# Patient Record
Sex: Female | Born: 1995 | Race: Black or African American | Hispanic: No | Marital: Single | Smoking: Never smoker
Health system: Southern US, Community
[De-identification: ages and names within clinical notes are randomized; demographics above are authoritative.]

## PROBLEM LIST (undated history)

## (undated) DIAGNOSIS — N63 Unspecified lump in unspecified breast: Secondary | ICD-10-CM

## (undated) DIAGNOSIS — K219 Gastro-esophageal reflux disease without esophagitis: Secondary | ICD-10-CM

## (undated) HISTORY — PX: TONSILLECTOMY AND ADENOIDECTOMY: SHX28

## (undated) HISTORY — DX: Unspecified lump in unspecified breast: N63.0

## (undated) HISTORY — DX: Gastro-esophageal reflux disease without esophagitis: K21.9

---
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
## 2015-04-15 ENCOUNTER — Other Ambulatory Visit (HOSPITAL_COMMUNITY): Payer: Self-pay | Admitting: Physician Assistant

## 2015-04-15 DIAGNOSIS — N6320 Unspecified lump in the left breast, unspecified quadrant: Secondary | ICD-10-CM

## 2015-04-29 ENCOUNTER — Ambulatory Visit (HOSPITAL_COMMUNITY)
Admission: RE | Admit: 2015-04-29 | Discharge: 2015-04-29 | Disposition: A | Discharge status: Home / Self Care | Payer: Medicaid Other | Source: Ambulatory Visit | Attending: Physician Assistant | Admitting: Physician Assistant

## 2015-04-29 DIAGNOSIS — N6320 Unspecified lump in the left breast, unspecified quadrant: Secondary | ICD-10-CM

## 2015-04-29 DIAGNOSIS — N63 Unspecified lump in breast: Secondary | ICD-10-CM | POA: Insufficient documentation

## 2016-06-25 ENCOUNTER — Encounter: Payer: Self-pay | Admitting: *Deleted

## 2016-07-09 ENCOUNTER — Other Ambulatory Visit: Payer: Self-pay | Admitting: Obstetrics and Gynecology

## 2016-07-09 DIAGNOSIS — O3680X0 Pregnancy with inconclusive fetal viability, not applicable or unspecified: Secondary | ICD-10-CM

## 2016-07-12 ENCOUNTER — Ambulatory Visit (INDEPENDENT_AMBULATORY_CARE_PROVIDER_SITE_OTHER): Payer: Medicaid Other

## 2016-07-12 DIAGNOSIS — Z3A09 9 weeks gestation of pregnancy: Secondary | ICD-10-CM

## 2016-07-12 DIAGNOSIS — O3680X0 Pregnancy with inconclusive fetal viability, not applicable or unspecified: Secondary | ICD-10-CM | POA: Diagnosis not present

## 2016-07-12 NOTE — Progress Notes (Signed)
US 8+3 wks,single IUP w/ys,pos fht 176 bpm,normal ov's bilat,crl 16.8 mm

## 2016-07-21 ENCOUNTER — Ambulatory Visit (INDEPENDENT_AMBULATORY_CARE_PROVIDER_SITE_OTHER): Payer: Medicaid Other | Admitting: Advanced Practice Midwife

## 2016-07-21 ENCOUNTER — Encounter: Payer: Self-pay | Admitting: Advanced Practice Midwife

## 2016-07-21 VITALS — BP 118/70 | HR 80 | Ht 69.0 in | Wt 262.0 lb

## 2016-07-21 DIAGNOSIS — Z331 Pregnant state, incidental: Secondary | ICD-10-CM

## 2016-07-21 DIAGNOSIS — Z3682 Encounter for antenatal screening for nuchal translucency: Secondary | ICD-10-CM

## 2016-07-21 DIAGNOSIS — Z1389 Encounter for screening for other disorder: Secondary | ICD-10-CM

## 2016-07-21 DIAGNOSIS — Z3491 Encounter for supervision of normal pregnancy, unspecified, first trimester: Secondary | ICD-10-CM

## 2016-07-21 DIAGNOSIS — Z0283 Encounter for blood-alcohol and blood-drug test: Secondary | ICD-10-CM

## 2016-07-21 DIAGNOSIS — Z3400 Encounter for supervision of normal first pregnancy, unspecified trimester: Secondary | ICD-10-CM | POA: Insufficient documentation

## 2016-07-21 DIAGNOSIS — Z3401 Encounter for supervision of normal first pregnancy, first trimester: Secondary | ICD-10-CM | POA: Diagnosis not present

## 2016-07-21 DIAGNOSIS — Z3A10 10 weeks gestation of pregnancy: Secondary | ICD-10-CM

## 2016-07-21 DIAGNOSIS — Z1159 Encounter for screening for other viral diseases: Secondary | ICD-10-CM

## 2016-07-21 DIAGNOSIS — Z369 Encounter for antenatal screening, unspecified: Secondary | ICD-10-CM

## 2016-07-21 DIAGNOSIS — Z118 Encounter for screening for other infectious and parasitic diseases: Secondary | ICD-10-CM

## 2016-07-21 LAB — POCT URINALYSIS DIPSTICK
Blood, UA: NEGATIVE
Glucose, UA: NEGATIVE
Ketones, UA: NEGATIVE
Leukocytes, UA: NEGATIVE
Nitrite, UA: NEGATIVE
PROTEIN UA: NEGATIVE

## 2016-07-21 NOTE — Progress Notes (Signed)
  Subjective:    Amanda SayresMoesha T Furlough is a G1P0 4442w5d being seen today for her first obstetrical visit.  Her obstetrical history is significant for first pregnancy.  Pregnancy history fully reviewed.  Patient reports no complaints.  Vitals:   07/21/16 1145 07/21/16 1201  BP: 118/70   Pulse: 80   Weight: 262 lb (118.8 kg)   Height:  5\' 9"  (1.753 m)    HISTORY: OB History  Gravida Para Term Preterm AB Living  1            SAB TAB Ectopic Multiple Live Births               # Outcome Date GA Lbr Len/2nd Weight Sex Delivery Anes PTL Lv  1 Current              Past Medical History:  Diagnosis Date  . Breast lump    left breast  . GERD without esophagitis    Past Surgical History:  Procedure Laterality Date  . TONSILLECTOMY AND ADENOIDECTOMY     Family History  Problem Relation Age of Onset  . Hypertension Mother   . Anemia Mother   . Diabetes Other   . Kidney Stones Other   . Hypertension Maternal Grandmother   . Diabetes Maternal Grandmother   . Pancreatitis Maternal Grandmother   . Cholecystitis Maternal Grandmother   . Prostate cancer Maternal Grandfather   . Heart disease Maternal Grandfather   . Stroke Maternal Grandfather      Exam                                      System:     Skin: normal coloration and turgor, no rashes    Neurologic: oriented, normal, normal mood   Extremities: normal strength, tone, and muscle mass   HEENT PERRLA   Mouth/Teeth mucous membranes moist, normal dentition   Neck supple and no masses   Cardiovascular: regular rate and rhythm   Respiratory:  appears well, vitals normal, no respiratory distress, acyanotic   Abdomen: soft, non-tender;  FHR: 150          Assessment:    Pregnancy: G1P0 Patient Active Problem List   Diagnosis Date Noted  . Supervision of normal first pregnancy 07/21/2016        Plan:     Initial labs drawn. Continue prenatal vitamins  Problem list reviewed and updated  Reviewed n/v  relief measures and warning s/s to report  Reviewed recommended weight gain based on pre-gravid BMI  Encouraged well-balanced diet Genetic Screening discussed Integrated Screen: requested.  Ultrasound discussed; fetal survey: requested.  Return in about 3 weeks (around 08/11/2016) for LROB, US:NT+1st IT.  CRESENZO-DISHMAN,Stepheny Canal 07/21/2016

## 2016-07-21 NOTE — Patient Instructions (Signed)
 First Trimester of Pregnancy The first trimester of pregnancy is from week 1 until the end of week 12 (months 1 through 3). A week after a sperm fertilizes an egg, the egg will implant on the wall of the uterus. This embryo will begin to develop into a baby. Genes from you and your partner are forming the baby. The female genes determine whether the baby is a boy or a girl. At 6-8 weeks, the eyes and face are formed, and the heartbeat can be seen on ultrasound. At the end of 12 weeks, all the baby's organs are formed.  Now that you are pregnant, you will want to do everything you can to have a healthy baby. Two of the most important things are to get good prenatal care and to follow your health care provider's instructions. Prenatal care is all the medical care you receive before the baby's birth. This care will help prevent, find, and treat any problems during the pregnancy and childbirth. BODY CHANGES Your body goes through many changes during pregnancy. The changes vary from woman to woman.   You may gain or lose a couple of pounds at first.  You may feel sick to your stomach (nauseous) and throw up (vomit). If the vomiting is uncontrollable, call your health care provider.  You may tire easily.  You may develop headaches that can be relieved by medicines approved by your health care provider.  You may urinate more often. Painful urination may mean you have a bladder infection.  You may develop heartburn as a result of your pregnancy.  You may develop constipation because certain hormones are causing the muscles that push waste through your intestines to slow down.  You may develop hemorrhoids or swollen, bulging veins (varicose veins).  Your breasts may begin to grow larger and become tender. Your nipples may stick out more, and the tissue that surrounds them (areola) may become darker.  Your gums may bleed and may be sensitive to brushing and flossing.  Dark spots or blotches  (chloasma, mask of pregnancy) may develop on your face. This will likely fade after the baby is born.  Your menstrual periods will stop.  You may have a loss of appetite.  You may develop cravings for certain kinds of food.  You may have changes in your emotions from day to day, such as being excited to be pregnant or being concerned that something may go wrong with the pregnancy and baby.  You may have more vivid and strange dreams.  You may have changes in your hair. These can include thickening of your hair, rapid growth, and changes in texture. Some women also have hair loss during or after pregnancy, or hair that feels dry or thin. Your hair will most likely return to normal after your baby is born. WHAT TO EXPECT AT YOUR PRENATAL VISITS During a routine prenatal visit:  You will be weighed to make sure you and the baby are growing normally.  Your blood pressure will be taken.  Your abdomen will be measured to track your baby's growth.  The fetal heartbeat will be listened to starting around week 10 or 12 of your pregnancy.  Test results from any previous visits will be discussed. Your health care provider may ask you:  How you are feeling.  If you are feeling the baby move.  If you have had any abnormal symptoms, such as leaking fluid, bleeding, severe headaches, or abdominal cramping.  If you have any questions. Other   tests that may be performed during your first trimester include:  Blood tests to find your blood type and to check for the presence of any previous infections. They will also be used to check for low iron levels (anemia) and Rh antibodies. Later in the pregnancy, blood tests for diabetes will be done along with other tests if problems develop.  Urine tests to check for infections, diabetes, or protein in the urine.  An ultrasound to confirm the proper growth and development of the baby.  An amniocentesis to check for possible genetic problems.  Fetal  screens for spina bifida and Down syndrome.  You may need other tests to make sure you and the baby are doing well. HOME CARE INSTRUCTIONS  Medicines  Follow your health care provider's instructions regarding medicine use. Specific medicines may be either safe or unsafe to take during pregnancy.  Take your prenatal vitamins as directed.  If you develop constipation, try taking a stool softener if your health care provider approves. Diet  Eat regular, well-balanced meals. Choose a variety of foods, such as meat or vegetable-based protein, fish, milk and low-fat dairy products, vegetables, fruits, and whole grain breads and cereals. Your health care provider will help you determine the amount of weight gain that is right for you.  Avoid raw meat and uncooked cheese. These carry germs that can cause birth defects in the baby.  Eating four or five small meals rather than three large meals a day may help relieve nausea and vomiting. If you start to feel nauseous, eating a few soda crackers can be helpful. Drinking liquids between meals instead of during meals also seems to help nausea and vomiting.  If you develop constipation, eat more high-fiber foods, such as fresh vegetables or fruit and whole grains. Drink enough fluids to keep your urine clear or pale yellow. Activity and Exercise  Exercise only as directed by your health care provider. Exercising will help you:  Control your weight.  Stay in shape.  Be prepared for labor and delivery.  Experiencing pain or cramping in the lower abdomen or low back is a good sign that you should stop exercising. Check with your health care provider before continuing normal exercises.  Try to avoid standing for long periods of time. Move your legs often if you must stand in one place for a long time.  Avoid heavy lifting.  Wear low-heeled shoes, and practice good posture.  You may continue to have sex unless your health care provider directs you  otherwise. Relief of Pain or Discomfort  Wear a good support bra for breast tenderness.   Take warm sitz baths to soothe any pain or discomfort caused by hemorrhoids. Use hemorrhoid cream if your health care provider approves.   Rest with your legs elevated if you have leg cramps or low back pain.  If you develop varicose veins in your legs, wear support hose. Elevate your feet for 15 minutes, 3-4 times a day. Limit salt in your diet. Prenatal Care  Schedule your prenatal visits by the twelfth week of pregnancy. They are usually scheduled monthly at first, then more often in the last 2 months before delivery.  Write down your questions. Take them to your prenatal visits.  Keep all your prenatal visits as directed by your health care provider. Safety  Wear your seat belt at all times when driving.  Make a list of emergency phone numbers, including numbers for family, friends, the hospital, and police and fire departments. General   Tips  Ask your health care provider for a referral to a local prenatal education class. Begin classes no later than at the beginning of month 6 of your pregnancy.  Ask for help if you have counseling or nutritional needs during pregnancy. Your health care provider can offer advice or refer you to specialists for help with various needs.  Do not use hot tubs, steam rooms, or saunas.  Do not douche or use tampons or scented sanitary pads.  Do not cross your legs for long periods of time.  Avoid cat litter boxes and soil used by cats. These carry germs that can cause birth defects in the baby and possibly loss of the fetus by miscarriage or stillbirth.  Avoid all smoking, herbs, alcohol, and medicines not prescribed by your health care provider. Chemicals in these affect the formation and growth of the baby.  Schedule a dentist appointment. At home, brush your teeth with a soft toothbrush and be gentle when you floss. SEEK MEDICAL CARE IF:   You have  dizziness.  You have mild pelvic cramps, pelvic pressure, or nagging pain in the abdominal area.  You have persistent nausea, vomiting, or diarrhea.  You have a bad smelling vaginal discharge.  You have pain with urination.  You notice increased swelling in your face, hands, legs, or ankles. SEEK IMMEDIATE MEDICAL CARE IF:   You have a fever.  You are leaking fluid from your vagina.  You have spotting or bleeding from your vagina.  You have severe abdominal cramping or pain.  You have rapid weight gain or loss.  You vomit blood or material that looks like coffee grounds.  You are exposed to German measles and have never had them.  You are exposed to fifth disease or chickenpox.  You develop a severe headache.  You have shortness of breath.  You have any kind of trauma, such as from a fall or a car accident. Document Released: 10/12/2001 Document Revised: 03/04/2014 Document Reviewed: 08/28/2013 [employer] Patient Information 2015 [employer]. This information is not intended to replace advice given to you by your health care provider. Make sure you discuss any questions you have with your health care provider.   Nausea & Vomiting  Have saltine crackers or pretzels by your bed and eat a few bites before you raise your head out of bed in the morning  Eat small frequent meals throughout the day instead of large meals  Drink plenty of fluids throughout the day to stay hydrated, just don't drink a lot of fluids with your meals.  This can make your stomach fill up faster making you feel sick  Do not brush your teeth right after you eat  Products with real ginger are good for nausea, like ginger ale and ginger hard candy Make sure it says made with real ginger!  Sucking on sour candy like lemon heads is also good for nausea  If your prenatal vitamins make you nauseated, take them at night so you will sleep through the nausea  Sea Bands  If you feel like you need  medicine for the nausea & vomiting please let us know  If you are unable to keep any fluids or food down please let us know   Constipation  Drink plenty of fluid, preferably water, throughout the day  Eat foods high in fiber such as fruits, vegetables, and grains  Exercise, such as walking, is a good way to keep your bowels regular  Drink warm fluids, especially warm   prune juice, or decaf coffee  Eat a 1/2 cup of real oatmeal (not instant), 1/2 cup applesauce, and 1/2-1 cup warm prune juice every day  If needed, you may take Colace (docusate sodium) stool softener once or twice a day to help keep the stool soft. If you are pregnant, wait until you are out of your first trimester (12-14 weeks of pregnancy)  If you still are having problems with constipation, you may take Miralax once daily as needed to help keep your bowels regular.  If you are pregnant, wait until you are out of your first trimester (12-14 weeks of pregnancy)  Safe Medications in Pregnancy   Acne: Benzoyl Peroxide Salicylic Acid  Backache/Headache: Tylenol: 2 regular strength every 4 hours OR              2 Extra strength every 6 hours  Colds/Coughs/Allergies: Benadryl (alcohol free) 25 mg every 6 hours as needed Breath right strips Claritin Cepacol throat lozenges Chloraseptic throat spray Cold-Eeze- up to three times per day Cough drops, alcohol free Flonase (by prescription only) Guaifenesin Mucinex Robitussin DM (plain only, alcohol free) Saline nasal spray/drops Sudafed (pseudoephedrine) & Actifed ** use only after [redacted] weeks gestation and if you do not have high blood pressure Tylenol Vicks Vaporub Zinc lozenges Zyrtec   Constipation: Colace Ducolax suppositories Fleet enema Glycerin suppositories Metamucil Milk of magnesia Miralax Senokot Smooth move tea  Diarrhea: Kaopectate Imodium A-D  *NO pepto Bismol  Hemorrhoids: Anusol Anusol HC Preparation  H Tucks  Indigestion: Tums Maalox Mylanta Zantac  Pepcid  Insomnia: Benadryl (alcohol free) 25mg every 6 hours as needed Tylenol PM Unisom, no Gelcaps  Leg Cramps: Tums MagGel  Nausea/Vomiting:  Bonine Dramamine Emetrol Ginger extract Sea bands Meclizine  Nausea medication to take during pregnancy:  Unisom (doxylamine succinate 25 mg tablets) Take one tablet daily at bedtime. If symptoms are not adequately controlled, the dose can be increased to a maximum recommended dose of two tablets daily (1/2 tablet in the morning, 1/2 tablet mid-afternoon and one at bedtime). Vitamin B6 100mg tablets. Take one tablet twice a day (up to 200 mg per day).  Skin Rashes: Aveeno products Benadryl cream or 25mg every 6 hours as needed Calamine Lotion 1% cortisone cream  Yeast infection: Gyne-lotrimin 7 Monistat 7   **If taking multiple medications, please check labels to avoid duplicating the same active ingredients **take medication as directed on the label ** Do not exceed 4000 mg of tylenol in 24 hours **Do not take medications that contain aspirin or ibuprofen      

## 2016-07-22 LAB — CBC
HEMOGLOBIN: 13.4 g/dL (ref 11.1–15.9)
Hematocrit: 39.3 % (ref 34.0–46.6)
MCH: 32.1 pg (ref 26.6–33.0)
MCHC: 34.1 g/dL (ref 31.5–35.7)
MCV: 94 fL (ref 79–97)
Platelets: 327 10*3/uL (ref 150–379)
RBC: 4.18 x10E6/uL (ref 3.77–5.28)
RDW: 13.4 % (ref 12.3–15.4)
WBC: 8 10*3/uL (ref 3.4–10.8)

## 2016-07-22 LAB — PMP SCREEN PROFILE (10S), URINE
AMPHETAMINE SCRN UR: NEGATIVE ng/mL
Barbiturate Screen, Ur: NEGATIVE ng/mL
Benzodiazepine Screen, Urine: NEGATIVE ng/mL
Cannabinoids Ur Ql Scn: NEGATIVE ng/mL
Cocaine(Metab.)Screen, Urine: NEGATIVE ng/mL
Creatinine(Crt), U: 80.2 mg/dL (ref 20.0–300.0)
Methadone Scn, Ur: NEGATIVE ng/mL
Opiate Scrn, Ur: NEGATIVE ng/mL
Oxycodone+Oxymorphone Ur Ql Scn: NEGATIVE ng/mL
PCP SCRN UR: NEGATIVE ng/mL
PH UR, DRUG SCRN: 6.4 (ref 4.5–8.9)
Propoxyphene, Screen: NEGATIVE ng/mL

## 2016-07-22 LAB — URINALYSIS, ROUTINE W REFLEX MICROSCOPIC
BILIRUBIN UA: NEGATIVE
Glucose, UA: NEGATIVE
Ketones, UA: NEGATIVE
LEUKOCYTES UA: NEGATIVE
Nitrite, UA: NEGATIVE
PROTEIN UA: NEGATIVE
RBC, UA: NEGATIVE
Specific Gravity, UA: 1.014 (ref 1.005–1.030)
Urobilinogen, Ur: 0.2 mg/dL (ref 0.2–1.0)
pH, UA: 7 (ref 5.0–7.5)

## 2016-07-22 LAB — ANTIBODY SCREEN: ANTIBODY SCREEN: NEGATIVE

## 2016-07-22 LAB — HEPATITIS B SURFACE ANTIGEN: HEP B S AG: NEGATIVE

## 2016-07-22 LAB — RUBELLA SCREEN: Rubella Antibodies, IGG: 5.46 index (ref >0.99)

## 2016-07-22 LAB — ABO/RH: Rh Factor: NEGATIVE

## 2016-07-22 LAB — SICKLE CELL SCREEN: Sickle Cell Screen: NEGATIVE

## 2016-07-22 LAB — RPR: RPR Ser Ql: NONREACTIVE

## 2016-07-22 LAB — VARICELLA ZOSTER ANTIBODY, IGG: VARICELLA: 1697 {index} (ref >165)

## 2016-07-22 LAB — HIV ANTIBODY (ROUTINE TESTING W REFLEX): HIV Screen 4th Generation wRfx: NONREACTIVE

## 2016-07-23 LAB — URINE CULTURE: Organism ID, Bacteria: NO GROWTH

## 2016-07-23 LAB — GC/CHLAMYDIA PROBE AMP
CHLAMYDIA, DNA PROBE: POSITIVE — AB
NEISSERIA GONORRHOEAE BY PCR: NEGATIVE

## 2016-07-26 ENCOUNTER — Encounter: Payer: Self-pay | Admitting: Advanced Practice Midwife

## 2016-07-26 ENCOUNTER — Encounter: Payer: Self-pay | Admitting: Obstetrics and Gynecology

## 2016-07-26 ENCOUNTER — Other Ambulatory Visit: Payer: Self-pay | Admitting: Advanced Practice Midwife

## 2016-07-26 DIAGNOSIS — A749 Chlamydial infection, unspecified: Secondary | ICD-10-CM

## 2016-07-26 DIAGNOSIS — Z8619 Personal history of other infectious and parasitic diseases: Secondary | ICD-10-CM | POA: Insufficient documentation

## 2016-07-26 DIAGNOSIS — O98811 Other maternal infectious and parasitic diseases complicating pregnancy, first trimester: Principal | ICD-10-CM

## 2016-07-26 MED ORDER — AZITHROMYCIN 500 MG PO TABS: 1000.0000 mg | ORAL_TABLET | Freq: Once | ORAL | 0 refills | Status: AC

## 2016-07-26 MED ORDER — AZITHROMYCIN 500 MG PO TABS: 1000.0000 mg | ORAL_TABLET | Freq: Once | ORAL | 0 refills | Status: DC

## 2016-07-26 NOTE — Progress Notes (Unsigned)
Azithromycin 1gm PO fo r+ CHL.  Message left for pt to provide parter's info if he wants rx.

## 2016-08-11 ENCOUNTER — Ambulatory Visit (INDEPENDENT_AMBULATORY_CARE_PROVIDER_SITE_OTHER): Payer: Medicaid Other | Admitting: Obstetrics and Gynecology

## 2016-08-11 ENCOUNTER — Encounter: Payer: Self-pay | Admitting: Obstetrics and Gynecology

## 2016-08-11 ENCOUNTER — Ambulatory Visit (INDEPENDENT_AMBULATORY_CARE_PROVIDER_SITE_OTHER): Payer: Medicaid Other

## 2016-08-11 VITALS — BP 130/76 | HR 64 | Wt 263.8 lb

## 2016-08-11 DIAGNOSIS — Z3682 Encounter for antenatal screening for nuchal translucency: Secondary | ICD-10-CM | POA: Diagnosis not present

## 2016-08-11 DIAGNOSIS — Z1389 Encounter for screening for other disorder: Secondary | ICD-10-CM

## 2016-08-11 DIAGNOSIS — Z3401 Encounter for supervision of normal first pregnancy, first trimester: Secondary | ICD-10-CM

## 2016-08-11 DIAGNOSIS — Z3402 Encounter for supervision of normal first pregnancy, second trimester: Secondary | ICD-10-CM

## 2016-08-11 DIAGNOSIS — Z3A13 13 weeks gestation of pregnancy: Secondary | ICD-10-CM

## 2016-08-11 DIAGNOSIS — Z331 Pregnant state, incidental: Secondary | ICD-10-CM

## 2016-08-11 LAB — POCT URINALYSIS DIPSTICK
Glucose, UA: NEGATIVE
Ketones, UA: NEGATIVE
LEUKOCYTES UA: NEGATIVE
Nitrite, UA: NEGATIVE

## 2016-08-11 NOTE — Progress Notes (Signed)
G1P0  Estimated Date of Delivery: 02/18/17 LROB 5682w5d  Blood pressure 130/76, pulse 64, weight 263 lb 12.8 oz (119.7 kg), last menstrual period 05/14/2016.   Urine results:notable for trace blood and protein  Chief Complaint  Patient presents with  . Routine Prenatal Visit    NT IT    Patient complaints: none at this time. Patient denies any bleeding , rupture of membranes,or regular contractions.   refer to the ob flow sheet for FH and FHR, ,                          Physical Examination: General appearance - alert, well appearing, and in no distress                                      Abdomen - FH not indicated ,                                                         -FHR                                                                                               Pelvic - examination not indicated                                            Questions were answered. Assessment: LROB G1P0 @ 4882w5d                         Plan:  Continued routine obstetrical care,             May continue full work duties.            Classes emphasized. F/u in 4 weeks for routine prenatal visit.    By signing my name below, I, Sonum Patel, attest that this documentation has been prepared under the direction and in the presence of Tilda BurrowJohn V Alaney Witter, MD. Electronically Signed: Sonum Patel, Neurosurgeoncribe. 08/11/16. 9:54 AM.  I personally performed the services described in this documentation, which was SCRIBED in my presence. The recorded information has been reviewed and considered accurate. It has been edited as necessary during review. Tilda BurrowFERGUSON,Darryon Bastin V, MD

## 2016-08-11 NOTE — Progress Notes (Signed)
US 12+5 wks,measurements c/w dates,crl 67.4 mm,NB present,NT 1.6 mm,fhr 170 bpm,ant pl gr 0,normal ov's bilat

## 2016-08-13 LAB — MATERNAL SCREEN, INTEGRATED #1
Crown Rump Length: 67.4 mm
Gest. Age on Collection Date: 12.9 weeks
Maternal Age at EDD: 21.4 years
NUCHAL TRANSLUCENCY (NT): 1.6 mm
Number of Fetuses: 1
PAPP-A VALUE: 695.7 ng/mL
WEIGHT: 264 [lb_av]

## 2016-09-08 ENCOUNTER — Encounter: Payer: Self-pay | Admitting: Advanced Practice Midwife

## 2016-09-08 ENCOUNTER — Ambulatory Visit (INDEPENDENT_AMBULATORY_CARE_PROVIDER_SITE_OTHER): Payer: Medicaid Other | Admitting: Advanced Practice Midwife

## 2016-09-08 VITALS — BP 128/74 | HR 92 | Wt 268.0 lb

## 2016-09-08 DIAGNOSIS — Z3682 Encounter for antenatal screening for nuchal translucency: Secondary | ICD-10-CM

## 2016-09-08 DIAGNOSIS — Z363 Encounter for antenatal screening for malformations: Secondary | ICD-10-CM

## 2016-09-08 DIAGNOSIS — Z3A17 17 weeks gestation of pregnancy: Secondary | ICD-10-CM

## 2016-09-08 DIAGNOSIS — Z1389 Encounter for screening for other disorder: Secondary | ICD-10-CM

## 2016-09-08 DIAGNOSIS — Z3402 Encounter for supervision of normal first pregnancy, second trimester: Secondary | ICD-10-CM

## 2016-09-08 DIAGNOSIS — Z331 Pregnant state, incidental: Secondary | ICD-10-CM

## 2016-09-08 LAB — POCT URINALYSIS DIPSTICK
Glucose, UA: NEGATIVE
KETONES UA: NEGATIVE
LEUKOCYTES UA: NEGATIVE
Nitrite, UA: NEGATIVE

## 2016-09-08 NOTE — Progress Notes (Signed)
G1P0 3770w5d Estimated Date of Delivery: 02/18/17  Blood pressure 128/74, pulse 92, weight 268 lb (121.6 kg), last menstrual period 05/14/2016.   BP weight and urine results all reviewed and noted.  Please refer to the obstetrical flow sheet for the fundal height and fetal heart rate documentation:  Patient reports some fetal movement, denies any bleeding and no rupture of membranes symptoms or regular contractions. Patient is without complaints. All questions were answered.  Orders Placed This Encounter  Procedures  . US OB Comp + 14 Wk  . Maternal Screen, Integrated #2  . POCT urinalysis dipstick    Plan:  Continued routine obstetrical care, 2nd IT today  Return in about 3 weeks (around 09/29/2016) for ZO:[id number]:Anatomy, LROB.

## 2016-09-08 NOTE — Patient Instructions (Signed)

## 2016-09-10 LAB — MATERNAL SCREEN, INTEGRATED #2
AFP MARKER: 28.1 ng/mL
AFP MoM: 1.21
Crown Rump Length: 67.4 mm
DIA MoM: 1.06
DIA Value: 133.8 pg/mL
Estriol, Unconjugated: 0.64 ng/mL
GESTATIONAL AGE: 16.9 wk
Gest. Age on Collection Date: 12.9 weeks
HCG VALUE: 42.4 [IU]/mL
MATERNAL AGE AT EDD: 21.4 a
Nuchal Translucency (NT): 1.6 mm
Nuchal Translucency MoM: 0.95
Number of Fetuses: 1
PAPP-A MOM: 1.3
PAPP-A Value: 695.7 ng/mL
Test Results:: NEGATIVE
WEIGHT: 264 [lb_av]
Weight: 264 [lb_av]
hCG MoM: 2.28
uE3 MoM: 0.79

## 2016-09-29 ENCOUNTER — Ambulatory Visit (INDEPENDENT_AMBULATORY_CARE_PROVIDER_SITE_OTHER): Payer: Medicaid Other

## 2016-09-29 ENCOUNTER — Ambulatory Visit (INDEPENDENT_AMBULATORY_CARE_PROVIDER_SITE_OTHER): Payer: Medicaid Other | Admitting: Advanced Practice Midwife

## 2016-09-29 ENCOUNTER — Encounter: Payer: Self-pay | Admitting: Advanced Practice Midwife

## 2016-09-29 VITALS — BP 130/80 | HR 74 | Wt 272.0 lb

## 2016-09-29 DIAGNOSIS — O23592 Infection of other part of genital tract in pregnancy, second trimester: Secondary | ICD-10-CM

## 2016-09-29 DIAGNOSIS — Z3402 Encounter for supervision of normal first pregnancy, second trimester: Secondary | ICD-10-CM

## 2016-09-29 DIAGNOSIS — Z331 Pregnant state, incidental: Secondary | ICD-10-CM

## 2016-09-29 DIAGNOSIS — Z363 Encounter for antenatal screening for malformations: Secondary | ICD-10-CM | POA: Diagnosis not present

## 2016-09-29 DIAGNOSIS — O321XX1 Maternal care for breech presentation, fetus 1: Secondary | ICD-10-CM

## 2016-09-29 DIAGNOSIS — A749 Chlamydial infection, unspecified: Secondary | ICD-10-CM

## 2016-09-29 DIAGNOSIS — Z3A20 20 weeks gestation of pregnancy: Secondary | ICD-10-CM | POA: Diagnosis not present

## 2016-09-29 DIAGNOSIS — Z1389 Encounter for screening for other disorder: Secondary | ICD-10-CM

## 2016-09-29 DIAGNOSIS — O98811 Other maternal infectious and parasitic diseases complicating pregnancy, first trimester: Secondary | ICD-10-CM

## 2016-09-29 LAB — POCT URINALYSIS DIPSTICK
Blood, UA: NEGATIVE
Glucose, UA: NEGATIVE
KETONES UA: NEGATIVE
LEUKOCYTES UA: NEGATIVE
Nitrite, UA: NEGATIVE
Protein, UA: NEGATIVE

## 2016-09-29 NOTE — Progress Notes (Signed)
US 19+5 wks,breech,ant pl gr 0,normal ov's bilat,cx 3.7 cm,fhr 165 bpm,svp of fluid 5.5 cm,efw 343 g,anatomy complete,no obvious abnormalities seen

## 2016-09-29 NOTE — Progress Notes (Signed)
G1P0 1826w5d Estimated Date of Delivery: 02/18/17  Last menstrual period 05/14/2016.   BP weight and urine results all reviewed and noted.  Please refer to the obstetrical flow sheet for the fundal height and fetal heart rate documentation:  US 19+5 wks,breech,ant pl gr 0,normal ov's bilat,cx 3.7 cm,fhr 165 bpm,svp of fluid 5.5 cm,efw 343 g,anatomy complete,no obvious abnormalities seen  Patient reports good fetal movement, denies any bleeding and no rupture of membranes symptoms or regular contractions. Patient is without complaints. All questions were answered.  Orders Placed This Encounter  Procedures  . GC/Chlamydia Probe Amp  . POCT urinalysis dipstick    Plan:  Continued routine obstetrical care,   Return in about 4 weeks (around 10/27/2016) for LROB.

## 2016-10-01 LAB — GC/CHLAMYDIA PROBE AMP
Chlamydia trachomatis, NAA: NEGATIVE
Neisseria gonorrhoeae by PCR: NEGATIVE

## 2016-10-27 ENCOUNTER — Encounter: Payer: Self-pay | Admitting: Women's Health

## 2016-10-27 ENCOUNTER — Ambulatory Visit (INDEPENDENT_AMBULATORY_CARE_PROVIDER_SITE_OTHER): Payer: Medicaid Other | Admitting: Women's Health

## 2016-10-27 VITALS — BP 142/84 | HR 72 | Wt 283.0 lb

## 2016-10-27 DIAGNOSIS — R030 Elevated blood-pressure reading, without diagnosis of hypertension: Secondary | ICD-10-CM

## 2016-10-27 DIAGNOSIS — O26899 Other specified pregnancy related conditions, unspecified trimester: Secondary | ICD-10-CM

## 2016-10-27 DIAGNOSIS — Z331 Pregnant state, incidental: Secondary | ICD-10-CM

## 2016-10-27 DIAGNOSIS — Z6791 Unspecified blood type, Rh negative: Secondary | ICD-10-CM

## 2016-10-27 DIAGNOSIS — Z3A24 24 weeks gestation of pregnancy: Secondary | ICD-10-CM

## 2016-10-27 DIAGNOSIS — Z3402 Encounter for supervision of normal first pregnancy, second trimester: Secondary | ICD-10-CM

## 2016-10-27 DIAGNOSIS — Z1389 Encounter for screening for other disorder: Secondary | ICD-10-CM

## 2016-10-27 LAB — POCT URINALYSIS DIPSTICK
Blood, UA: NEGATIVE
Glucose, UA: NEGATIVE
KETONES UA: NEGATIVE
Leukocytes, UA: NEGATIVE
Nitrite, UA: NEGATIVE
Protein, UA: NEGATIVE

## 2016-10-27 NOTE — Progress Notes (Signed)
Low-risk OB appointment G1P0 4981w5d Estimated Date of Delivery: 02/18/17 BP 140/78   Pulse 72   Wt 283 lb (128.4 kg)   LMP 05/14/2016 (Exact Date)   BMI 41.79 kg/m   BP, weight, and urine reviewed.  Refer to obstetrical flow sheet for FH & FHR.  Reports good fm.  Denies regular uc's, lof, vb, or uti s/s. No complaints. BP elevated today- denies any h/o HTN, this is 1st elevation this pregnancy. Denies ha, visual changes, ruq/epigastric pain, n/v.  Some reflux, tums not helping- can try otc zantac, prilosec, or nexium- gave printed info on heartburn during pregnancy.  Discussed weight gain, 11lb since last visit, 21lb overall of recommended no more than 20lbs. Discussed decreasing carbs, increasing exercise, no more weight gain, ok to lose weight DTRs 2+, no clonus, no edema, no protineuria Reviewed ptl s/s, fm, pre-e s/s. Plan:  Continue routine obstetrical care  F/U in 2d for OB appointment/bp check- if still elevated will get pre-e labs, if normal will need to be scheduled for pn2/visit in 4wks

## 2016-10-27 NOTE — Patient Instructions (Addendum)
Call the office [phone]) or go to [facility] if:  You begin to have strong, frequent contractions  Your water breaks.  Sometimes it is a big gush of fluid, sometimes it is just a trickle that keeps getting your panties wet or running down your legs  You have vaginal bleeding.  It is normal to have a small amount of spotting if your cervix was checked.   You don't feel your baby moving like normal.  If you don't, get you something to eat and drink and lay down and focus on feeling your baby move.  If baby is still not moving like normal, you should call the office or go to [facility].   [place] Pediatricians/Family Doctors:  [facility] [phone]            [facility] [phone]                 [facility] [phone] (usually not accepting new patients unless you have family there already, you are always welcome to call and ask)            [facility] ([address] [place]) [phone]   [facility]:   [facility]: [phone]  [facility]: [phone]    Heartburn During Pregnancy (Nexium, Prilosec, or Zantac) Heartburn is a type of pain or discomfort that can happen in the throat or chest. It is often described as a burning sensation. Heartburn is common during pregnancy because:  A hormone (progesterone) that is released during pregnancy may relax the valve (lower esophageal sphincter, or LES) that separates the esophagus from the stomach. This allows stomach acid to move up into the esophagus, causing heartburn.  The uterus gets larger and pushes up on the stomach, which pushes more acid into the esophagus. This is especially true in the later stages of pregnancy. Heartburn usually goes away or gets better after giving birth. What are the causes? Heartburn is caused by stomach acid backing up into the esophagus (reflux). Reflux can be  triggered by:  Changing hormone levels.  Large meals.  Certain foods and beverages, such as coffee, chocolate, onions, and peppermint.  Exercise.  Increased stomach acid production. What increases the risk? You are more likely to experience heartburn during pregnancy if you:  Had heartburn prior to becoming pregnant.  Have been pregnant more than once before.  Are overweight or obese. The likelihood that you will get heartburn also increases as you get farther along in your pregnancy, especially during the last trimester. What are the signs or symptoms? Symptoms of this condition include:  Burning pain in the chest or lower throat.  Bitter taste in the mouth.  Coughing.  Problems swallowing.  Vomiting.  Hoarse voice.  Asthma. Symptoms may get worse when you lie down or bend over. Symptoms are often worse at night. How is this diagnosed? This condition is diagnosed based on:  Your medical history.  Your symptoms.  Blood tests to check for a certain type of bacteria associated with heartburn.  Whether taking heartburn medicine relieves your symptoms.  Examination of the stomach and esophagus using a tube with a light and camera on the end (endoscopy). How is this treated? Treatment varies depending on how severe your symptoms are. Your health care provider may recommend:  Over-the-counter medicines (antacids or acid reducers) for mild heartburn.  Prescription medicines to decrease stomach acid or to protect your stomach lining.  Certain changes in your diet.  Raising the head of your bed so it is higher than the foot of the bed. This helps prevent stomach acid from backing up into the esophagus when you are lying down. Follow these instructions at home: Eating and drinking  Do not drink alcohol during your pregnancy.  Identify foods and beverages that make your symptoms worse, and avoid them.  Beverages that you may want to avoid include:  Coffee and  tea (with or without caffeine).  Energy drinks and sports drinks.  Carbonated drinks or sodas.  Citrus fruit juices.  Foods that you may want to avoid include:  Chocolate and cocoa.  Peppermint and mint flavorings.  Garlic, onions, and horseradish.  Spicy and acidic foods, including peppers, chili powder, curry powder, vinegar, hot sauces, and barbecue sauce.  Citrus fruits, such as oranges, lemons, and limes.  Tomato-based foods, such as red sauce, chili, and salsa.  Fried and fatty foods, such as donuts, french fries, potato chips, and high-fat dressings.  High-fat meats, such as hot dogs, cold cuts, sausage, ham, and bacon.  High-fat dairy items, such as whole milk, butter, and cheese.  Eat small, frequent meals instead of large meals.  Avoid drinking large amounts of liquid with your meals.  Avoid eating meals during the 2-3 hours before bedtime.  Avoid lying down right after you eat.  Do not exercise right after you eat. Medicines  Take over-the-counter and prescription medicines only as told by your health care provider.  Do not take aspirin, ibuprofen, or other NSAIDs unless your health care provider tells you to do that.  You may be instructed to avoid medicines that contain sodium bicarbonate. General instructions  If directed, raise the head of your bed about 6 inches (15 cm) by putting blocks under the legs. Sleeping with more pillows does not effectively relieve heartburn because it only changes the position of your head.  Do not use any products that contain nicotine or tobacco, such as cigarettes and e-cigarettes. If you need help quitting, ask your health care provider.  Wear loose-fitting clothing.  Try to reduce your stress, such as with yoga or meditation. If you need help managing stress, ask your health care provider.  Maintain a healthy weight. If you are overweight, work with your health care provider to safely lose weight.  Keep all  follow-up visits as told by your health care provider. This is important. Contact a health care provider if:  You develop new symptoms.  Your symptoms do not improve with treatment.  You have unexplained weight loss.  You have difficulty swallowing.  You make loud sounds when you breathe (wheeze).  You have a cough that does not go away.  You have frequent heartburn for more than 2 weeks.  You have nausea or vomiting that does not get better with treatment.  You have pain in your abdomen. Get help right away if:  You have severe chest pain that spreads to your arm, neck, or jaw.  You feel sweaty, dizzy, or light-headed.  You have shortness of breath.  You have pain when swallowing.  You vomit, and your vomit looks like blood or coffee grounds.  Your stool is bloody or black. This information is not intended to replace advice given to you by your health care provider. Make sure you discuss any questions you have with your health care provider. Document Released: 10/15/2000 Document Revised: 07/05/2016 Document Reviewed: 07/05/2016 Elsevier Interactive Patient Education  2017 [employer]

## 2016-10-29 ENCOUNTER — Encounter: Payer: Self-pay | Admitting: Obstetrics & Gynecology

## 2016-10-29 ENCOUNTER — Ambulatory Visit (INDEPENDENT_AMBULATORY_CARE_PROVIDER_SITE_OTHER): Payer: Medicaid Other | Admitting: Obstetrics & Gynecology

## 2016-10-29 VITALS — BP 131/66 | HR 108 | Wt 279.0 lb

## 2016-10-29 DIAGNOSIS — Z331 Pregnant state, incidental: Secondary | ICD-10-CM

## 2016-10-29 DIAGNOSIS — Z3402 Encounter for supervision of normal first pregnancy, second trimester: Secondary | ICD-10-CM

## 2016-10-29 DIAGNOSIS — Z1389 Encounter for screening for other disorder: Secondary | ICD-10-CM

## 2016-10-29 DIAGNOSIS — Z3A24 24 weeks gestation of pregnancy: Secondary | ICD-10-CM

## 2016-10-29 LAB — POCT URINALYSIS DIPSTICK
Blood, UA: NEGATIVE
Glucose, UA: NEGATIVE
Ketones, UA: NEGATIVE
Leukocytes, UA: NEGATIVE
NITRITE UA: NEGATIVE

## 2016-10-29 NOTE — Progress Notes (Signed)
G1P0 1457w0d Estimated Date of Delivery: 02/18/17  Blood pressure 131/66, pulse (!) 108, weight 279 lb (126.6 kg), last menstrual period 05/14/2016.   BP weight and urine results all reviewed and noted.  Please refer to the obstetrical flow sheet for the fundal height and fetal heart rate documentation:  Patient reports good fetal movement, denies any bleeding and no rupture of membranes symptoms or regular contractions. Patient is without complaints. All questions were answered.  Orders Placed This Encounter  Procedures  . POCT urinalysis dipstick    Plan:  Continued routine obstetrical care, BP behaving PN2 next visit  Return in about 4 weeks (around 11/26/2016) for PN2, LROB.

## 2016-11-01 NOTE — L&D Delivery Note (Signed)
[place] 21 yo G1 now P1 admitted in for IOL 2/2 GHTN. Augmented with AROM.  Delivery Note At 12:54 PM a viable female was delivered via  (Presentation:LOA ; vertex ).  APGAR:8, 9 ; weight pending.   Placenta status: delivered intact with gentle traction , .  Cord: 3 vessels cord with the following complications: None .  Cord pH: Not collected  Anesthesia:  Epidural Episiotomy:  N/A Lacerations: 1st degree tear left labia, hemostatic no repair needed Est. Blood Loss (mL):  Mom to postpartum.  Baby to Couplet care / Skin to Skin.  Rochelle Nephew 02/01/2017, 1:09 PM

## 2016-11-26 ENCOUNTER — Other Ambulatory Visit: Payer: Medicaid Other

## 2016-11-26 ENCOUNTER — Encounter: Payer: Self-pay | Admitting: Women's Health

## 2016-11-26 ENCOUNTER — Ambulatory Visit (INDEPENDENT_AMBULATORY_CARE_PROVIDER_SITE_OTHER): Payer: Medicaid Other | Admitting: Women's Health

## 2016-11-26 VITALS — BP 120/80 | HR 78 | Temp 98.4°F | Wt 286.8 lb

## 2016-11-26 DIAGNOSIS — Z331 Pregnant state, incidental: Secondary | ICD-10-CM

## 2016-11-26 DIAGNOSIS — Z3402 Encounter for supervision of normal first pregnancy, second trimester: Secondary | ICD-10-CM

## 2016-11-26 DIAGNOSIS — O2343 Unspecified infection of urinary tract in pregnancy, third trimester: Secondary | ICD-10-CM

## 2016-11-26 DIAGNOSIS — Z131 Encounter for screening for diabetes mellitus: Secondary | ICD-10-CM

## 2016-11-26 DIAGNOSIS — Z3A28 28 weeks gestation of pregnancy: Secondary | ICD-10-CM

## 2016-11-26 DIAGNOSIS — Z3403 Encounter for supervision of normal first pregnancy, third trimester: Secondary | ICD-10-CM

## 2016-11-26 DIAGNOSIS — Z1389 Encounter for screening for other disorder: Secondary | ICD-10-CM

## 2016-11-26 LAB — POCT URINALYSIS DIPSTICK
Glucose, UA: NEGATIVE
KETONES UA: NEGATIVE
Leukocytes, UA: NEGATIVE
RBC UA: NEGATIVE

## 2016-11-26 MED ORDER — NITROFURANTOIN MONOHYD MACRO 100 MG PO CAPS: 100.0000 mg | ORAL_CAPSULE | Freq: Two times a day (BID) | ORAL | 0 refills | Status: DC

## 2016-11-26 NOTE — Patient Instructions (Signed)

## 2016-11-26 NOTE — Progress Notes (Signed)
Low-risk OB appointment G1P0 1059w0d Estimated Date of Delivery: 02/18/17 BP 120/80   Pulse 78   Temp 98.4 F (36.9 C)   Wt 286 lb 12.8 oz (130.1 kg)   LMP 05/14/2016 (Exact Date)   BMI 42.35 kg/m   BP, weight, and urine reviewed.  Refer to obstetrical flow sheet for FH & FHR.  Reports good fm.  Denies regular uc's, lof, vb. Frequent urination, no burning/discomfort. +nitrates, send cx, rx macrobid Reviewed ptl s/s, fkc. Recommended Tdap at HD/PCP per CDC guidelines.  Plan:  Continue routine obstetrical care  F/U in 4wks for OB appointment  PN2 today

## 2016-11-27 LAB — CBC
Hematocrit: 38.3 % (ref 34.0–46.6)
Hemoglobin: 12.5 g/dL (ref 11.1–15.9)
MCH: 31.9 pg (ref 26.6–33.0)
MCHC: 32.6 g/dL (ref 31.5–35.7)
MCV: 98 fL — AB (ref 79–97)
PLATELETS: 242 10*3/uL (ref 150–379)
RBC: 3.92 x10E6/uL (ref 3.77–5.28)
RDW: 13.7 % (ref 12.3–15.4)
WBC: 11.9 10*3/uL — ABNORMAL HIGH (ref 3.4–10.8)

## 2016-11-27 LAB — GLUCOSE TOLERANCE, 2 HOURS W/ 1HR
GLUCOSE, 1 HOUR: 122 mg/dL (ref 65–179)
Glucose, 2 hour: 102 mg/dL (ref 65–152)
Glucose, Fasting: 85 mg/dL (ref 65–91)

## 2016-11-27 LAB — ANTIBODY SCREEN: ANTIBODY SCREEN: NEGATIVE

## 2016-11-27 LAB — RPR: RPR: NONREACTIVE

## 2016-11-27 LAB — HIV ANTIBODY (ROUTINE TESTING W REFLEX): HIV SCREEN 4TH GENERATION: NONREACTIVE

## 2016-11-28 LAB — URINE CULTURE

## 2016-12-24 ENCOUNTER — Encounter: Payer: Self-pay | Admitting: Women's Health

## 2016-12-24 ENCOUNTER — Ambulatory Visit (INDEPENDENT_AMBULATORY_CARE_PROVIDER_SITE_OTHER): Payer: Medicaid Other | Admitting: Women's Health

## 2016-12-24 VITALS — BP 130/80 | HR 90 | Wt 295.0 lb

## 2016-12-24 DIAGNOSIS — Z1389 Encounter for screening for other disorder: Secondary | ICD-10-CM

## 2016-12-24 DIAGNOSIS — O360131 Maternal care for anti-D [Rh] antibodies, third trimester, fetus 1: Secondary | ICD-10-CM | POA: Diagnosis not present

## 2016-12-24 DIAGNOSIS — Z3A32 32 weeks gestation of pregnancy: Secondary | ICD-10-CM

## 2016-12-24 DIAGNOSIS — Z331 Pregnant state, incidental: Secondary | ICD-10-CM

## 2016-12-24 DIAGNOSIS — Z6791 Unspecified blood type, Rh negative: Secondary | ICD-10-CM

## 2016-12-24 DIAGNOSIS — O26899 Other specified pregnancy related conditions, unspecified trimester: Secondary | ICD-10-CM

## 2016-12-24 DIAGNOSIS — O2343 Unspecified infection of urinary tract in pregnancy, third trimester: Secondary | ICD-10-CM

## 2016-12-24 DIAGNOSIS — O1203 Gestational edema, third trimester: Secondary | ICD-10-CM

## 2016-12-24 DIAGNOSIS — Z3403 Encounter for supervision of normal first pregnancy, third trimester: Secondary | ICD-10-CM

## 2016-12-24 LAB — POCT URINALYSIS DIPSTICK
Glucose, UA: NEGATIVE
Ketones, UA: NEGATIVE
Nitrite, UA: NEGATIVE
Protein, UA: NEGATIVE

## 2016-12-24 MED ORDER — RHO D IMMUNE GLOBULIN 1500 UNIT/2ML IJ SOSY
300.0000 ug | PREFILLED_SYRINGE | Freq: Once | INTRAMUSCULAR | Status: AC
  Administered 2016-12-24: 300 ug via INTRAMUSCULAR

## 2016-12-24 NOTE — Progress Notes (Signed)
Low-risk OB appointment G1P0 8138w0d Estimated Date of Delivery: 02/18/17 BP 130/80   Pulse 90   Wt 295 lb (133.8 kg)   LMP 05/14/2016 (Exact Date)   BMI 43.56 kg/m   BP, weight, and urine reviewed.  Refer to obstetrical flow sheet for FH & FHR.  Reports good fm.  Denies regular uc's, lof, vb, or uti s/s. 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 Plan:  Continue routine obstetrical care  F/U in 2wks for OB appointment  Rhogam today

## 2016-12-24 NOTE — Patient Instructions (Signed)
Call the office ([phone]) or go to [facility] if:  You begin to have strong, frequent contractions  Your water breaks.  Sometimes it is a big gush of fluid, sometimes it is just a trickle that keeps getting your panties wet or running down your legs  You have vaginal bleeding.  It is normal to have a small amount of spotting if your cervix was checked.   You don't feel your baby moving like normal.  If you don't, get you something to eat and drink and lay down and focus on feeling your baby move.  You should feel at least 10 movements in 2 hours.  If you don't, you should call the office or go to [facility].     Preterm Labor and Birth Information The normal length of a pregnancy is 39-41 weeks. Preterm labor is when labor starts before 37 completed weeks of pregnancy. What are the risk factors for preterm labor? Preterm labor is more likely to occur in women who:  Have certain infections during pregnancy such as a bladder infection, sexually transmitted infection, or infection inside the uterus (chorioamnionitis).  Have a shorter-than-normal cervix.  Have gone into preterm labor before.  Have had surgery on their cervix.  Are younger than age 17 or older than age 35.  Are African American.  Are pregnant with twins or multiple babies (multiple gestation).  Take street drugs or smoke while pregnant.  Do not gain enough weight while pregnant.  Became pregnant shortly after having been pregnant. What are the symptoms of preterm labor? Symptoms of preterm labor include:  Cramps similar to those that can happen during a menstrual period. The cramps may happen with diarrhea.  Pain in the abdomen or lower back.  Regular uterine contractions that may feel like tightening of the abdomen.  A feeling of increased pressure in the pelvis.  Increased watery or bloody mucus discharge from the vagina.  Water breaking (ruptured amniotic sac). Why is it important to  recognize signs of preterm labor? It is important to recognize signs of preterm labor because babies who are born prematurely may not be fully developed. This can put them at an increased risk for:  Long-term (chronic) heart and lung problems.  Difficulty immediately after birth with regulating body systems, including blood sugar, body temperature, heart rate, and breathing rate.  Bleeding in the brain.  Cerebral palsy.  Learning difficulties.  Death. These risks are highest for babies who are born before 34 weeks of pregnancy. How is preterm labor treated? Treatment depends on the length of your pregnancy, your condition, and the health of your baby. It may involve:  Having a stitch (suture) placed in your cervix to prevent your cervix from opening too early (cerclage).  Taking or being given medicines, such as:  Hormone medicines. These may be given early in pregnancy to help support the pregnancy.  Medicine to stop contractions.  Medicines to help mature the baby's lungs. These may be prescribed if the risk of delivery is high.  Medicines to prevent your baby from developing cerebral palsy. If the labor happens before 34 weeks of pregnancy, you may need to stay in the hospital. What should I do if I think I am in preterm labor? If you think that you are going into preterm labor, call your health care provider right away. How can I prevent preterm labor in future pregnancies? To increase your chance of having a full-term pregnancy:  Do not use any tobacco products, such   as cigarettes, chewing tobacco, and e-cigarettes. If you need help quitting, ask your health care provider.  Do not use street drugs or medicines that have not been prescribed to you during your pregnancy.  Talk with your health care provider before taking any herbal supplements, even if you have been taking them regularly.  Make sure you gain a healthy amount of weight during your pregnancy.  Watch for  infection. If you think that you might have an infection, get it checked right away.  Make sure to tell your health care provider if you have gone into preterm labor before. This information is not intended to replace advice given to you by your health care provider. Make sure you discuss any questions you have with your health care provider. Document Released: 01/08/2004 Document Revised: 03/30/2016 Document Reviewed: 03/10/2016 Elsevier Interactive Patient Education  2017 Elsevier Inc.  

## 2017-01-07 ENCOUNTER — Encounter: Payer: Self-pay | Admitting: Women's Health

## 2017-01-07 ENCOUNTER — Ambulatory Visit (INDEPENDENT_AMBULATORY_CARE_PROVIDER_SITE_OTHER): Payer: Medicaid Other | Admitting: Women's Health

## 2017-01-07 VITALS — BP 128/74 | HR 111 | Wt 296.0 lb

## 2017-01-07 DIAGNOSIS — Z331 Pregnant state, incidental: Secondary | ICD-10-CM

## 2017-01-07 DIAGNOSIS — Z3403 Encounter for supervision of normal first pregnancy, third trimester: Secondary | ICD-10-CM

## 2017-01-07 DIAGNOSIS — Z3A34 34 weeks gestation of pregnancy: Secondary | ICD-10-CM

## 2017-01-07 DIAGNOSIS — Z1389 Encounter for screening for other disorder: Secondary | ICD-10-CM

## 2017-01-07 LAB — POCT URINALYSIS DIPSTICK
Blood, UA: NEGATIVE
GLUCOSE UA: NEGATIVE
Ketones, UA: NEGATIVE
NITRITE UA: NEGATIVE
Protein, UA: NEGATIVE

## 2017-01-07 NOTE — Progress Notes (Signed)
Low-risk OB appointment G1P0 5239w0d Estimated Date of Delivery: 02/18/17 BP 128/74   Pulse (!) 111   Wt 296 lb (134.3 kg)   LMP 05/14/2016 (Exact Date)   BMI 43.71 kg/m   BP, weight, and urine reviewed.  Refer to obstetrical flow sheet for FH & FHR.  Reports good fm.  Denies regular uc's, lof, vb, or uti s/s. No complaints. Reviewed ptl s/s, fkc. Plan:  Continue routine obstetrical care  F/U in 2wks for OB appointment

## 2017-01-07 NOTE — Patient Instructions (Signed)
Call the office ([phone]) or go to [facility] if:  You begin to have strong, frequent contractions  Your water breaks.  Sometimes it is a big gush of fluid, sometimes it is just a trickle that keeps getting your panties wet or running down your legs  You have vaginal bleeding.  It is normal to have a small amount of spotting if your cervix was checked.   You don't feel your baby moving like normal.  If you don't, get you something to eat and drink and lay down and focus on feeling your baby move.  You should feel at least 10 movements in 2 hours.  If you don't, you should call the office or go to [facility].     Preterm Labor and Birth Information The normal length of a pregnancy is 39-41 weeks. Preterm labor is when labor starts before 37 completed weeks of pregnancy. What are the risk factors for preterm labor? Preterm labor is more likely to occur in women who:  Have certain infections during pregnancy such as a bladder infection, sexually transmitted infection, or infection inside the uterus (chorioamnionitis).  Have a shorter-than-normal cervix.  Have gone into preterm labor before.  Have had surgery on their cervix.  Are younger than age 17 or older than age 35.  Are African American.  Are pregnant with twins or multiple babies (multiple gestation).  Take street drugs or smoke while pregnant.  Do not gain enough weight while pregnant.  Became pregnant shortly after having been pregnant. What are the symptoms of preterm labor? Symptoms of preterm labor include:  Cramps similar to those that can happen during a menstrual period. The cramps may happen with diarrhea.  Pain in the abdomen or lower back.  Regular uterine contractions that may feel like tightening of the abdomen.  A feeling of increased pressure in the pelvis.  Increased watery or bloody mucus discharge from the vagina.  Water breaking (ruptured amniotic sac). Why is it important to  recognize signs of preterm labor? It is important to recognize signs of preterm labor because babies who are born prematurely may not be fully developed. This can put them at an increased risk for:  Long-term (chronic) heart and lung problems.  Difficulty immediately after birth with regulating body systems, including blood sugar, body temperature, heart rate, and breathing rate.  Bleeding in the brain.  Cerebral palsy.  Learning difficulties.  Death. These risks are highest for babies who are born before 34 weeks of pregnancy. How is preterm labor treated? Treatment depends on the length of your pregnancy, your condition, and the health of your baby. It may involve:  Having a stitch (suture) placed in your cervix to prevent your cervix from opening too early (cerclage).  Taking or being given medicines, such as:  Hormone medicines. These may be given early in pregnancy to help support the pregnancy.  Medicine to stop contractions.  Medicines to help mature the baby's lungs. These may be prescribed if the risk of delivery is high.  Medicines to prevent your baby from developing cerebral palsy. If the labor happens before 34 weeks of pregnancy, you may need to stay in the hospital. What should I do if I think I am in preterm labor? If you think that you are going into preterm labor, call your health care provider right away. How can I prevent preterm labor in future pregnancies? To increase your chance of having a full-term pregnancy:  Do not use any tobacco products, such   as cigarettes, chewing tobacco, and e-cigarettes. If you need help quitting, ask your health care provider.  Do not use street drugs or medicines that have not been prescribed to you during your pregnancy.  Talk with your health care provider before taking any herbal supplements, even if you have been taking them regularly.  Make sure you gain a healthy amount of weight during your pregnancy.  Watch for  infection. If you think that you might have an infection, get it checked right away.  Make sure to tell your health care provider if you have gone into preterm labor before. This information is not intended to replace advice given to you by your health care provider. Make sure you discuss any questions you have with your health care provider. Document Released: 01/08/2004 Document Revised: 03/30/2016 Document Reviewed: 03/10/2016 Elsevier Interactive Patient Education  2017 Elsevier Inc.  

## 2017-01-21 ENCOUNTER — Encounter: Payer: Self-pay | Admitting: Women's Health

## 2017-01-21 ENCOUNTER — Ambulatory Visit (INDEPENDENT_AMBULATORY_CARE_PROVIDER_SITE_OTHER): Payer: Medicaid Other | Admitting: Women's Health

## 2017-01-21 VITALS — BP 136/74 | HR 64 | Wt 302.0 lb

## 2017-01-21 DIAGNOSIS — Z331 Pregnant state, incidental: Secondary | ICD-10-CM

## 2017-01-21 DIAGNOSIS — Z1389 Encounter for screening for other disorder: Secondary | ICD-10-CM

## 2017-01-21 DIAGNOSIS — Z3403 Encounter for supervision of normal first pregnancy, third trimester: Secondary | ICD-10-CM

## 2017-01-21 LAB — POCT URINALYSIS DIPSTICK
Glucose, UA: NEGATIVE
KETONES UA: NEGATIVE
LEUKOCYTES UA: NEGATIVE
Nitrite, UA: NEGATIVE

## 2017-01-21 NOTE — Patient Instructions (Signed)
Call the office ([phone]) or go to [facility] if:  You begin to have strong, frequent contractions  Your water breaks.  Sometimes it is a big gush of fluid, sometimes it is just a trickle that keeps getting your panties wet or running down your legs  You have vaginal bleeding.  It is normal to have a small amount of spotting if your cervix was checked.   You don't feel your baby moving like normal.  If you don't, get you something to eat and drink and lay down and focus on feeling your baby move.  You should feel at least 10 movements in 2 hours.  If you don't, you should call the office or go to [facility].     Preterm Labor and Birth Information The normal length of a pregnancy is 39-41 weeks. Preterm labor is when labor starts before 37 completed weeks of pregnancy. What are the risk factors for preterm labor? Preterm labor is more likely to occur in women who:  Have certain infections during pregnancy such as a bladder infection, sexually transmitted infection, or infection inside the uterus (chorioamnionitis).  Have a shorter-than-normal cervix.  Have gone into preterm labor before.  Have had surgery on their cervix.  Are younger than age 17 or older than age 35.  Are African American.  Are pregnant with twins or multiple babies (multiple gestation).  Take street drugs or smoke while pregnant.  Do not gain enough weight while pregnant.  Became pregnant shortly after having been pregnant. What are the symptoms of preterm labor? Symptoms of preterm labor include:  Cramps similar to those that can happen during a menstrual period. The cramps may happen with diarrhea.  Pain in the abdomen or lower back.  Regular uterine contractions that may feel like tightening of the abdomen.  A feeling of increased pressure in the pelvis.  Increased watery or bloody mucus discharge from the vagina.  Water breaking (ruptured amniotic sac). Why is it important to  recognize signs of preterm labor? It is important to recognize signs of preterm labor because babies who are born prematurely may not be fully developed. This can put them at an increased risk for:  Long-term (chronic) heart and lung problems.  Difficulty immediately after birth with regulating body systems, including blood sugar, body temperature, heart rate, and breathing rate.  Bleeding in the brain.  Cerebral palsy.  Learning difficulties.  Death. These risks are highest for babies who are born before 34 weeks of pregnancy. How is preterm labor treated? Treatment depends on the length of your pregnancy, your condition, and the health of your baby. It may involve:  Having a stitch (suture) placed in your cervix to prevent your cervix from opening too early (cerclage).  Taking or being given medicines, such as:  Hormone medicines. These may be given early in pregnancy to help support the pregnancy.  Medicine to stop contractions.  Medicines to help mature the baby's lungs. These may be prescribed if the risk of delivery is high.  Medicines to prevent your baby from developing cerebral palsy. If the labor happens before 34 weeks of pregnancy, you may need to stay in the hospital. What should I do if I think I am in preterm labor? If you think that you are going into preterm labor, call your health care provider right away. How can I prevent preterm labor in future pregnancies? To increase your chance of having a full-term pregnancy:  Do not use any tobacco products, such   as cigarettes, chewing tobacco, and e-cigarettes. If you need help quitting, ask your health care provider.  Do not use street drugs or medicines that have not been prescribed to you during your pregnancy.  Talk with your health care provider before taking any herbal supplements, even if you have been taking them regularly.  Make sure you gain a healthy amount of weight during your pregnancy.  Watch for  infection. If you think that you might have an infection, get it checked right away.  Make sure to tell your health care provider if you have gone into preterm labor before. This information is not intended to replace advice given to you by your health care provider. Make sure you discuss any questions you have with your health care provider. Document Released: 01/08/2004 Document Revised: 03/30/2016 Document Reviewed: 03/10/2016 Elsevier Interactive Patient Education  2017 Elsevier Inc.  

## 2017-01-21 NOTE — Progress Notes (Signed)
Low-risk OB appointment G1P0 528w0d Estimated Date of Delivery: 02/18/17 BP 136/74   Pulse 64   Wt (!) 302 lb (137 kg)   LMP 05/14/2016 (Exact Date)   BMI 44.60 kg/m   BP, weight, and urine reviewed.  Refer to obstetrical flow sheet for FH & FHR.  Reports good fm.  Denies regular uc's, lof, vb, or uti s/s. No complaints. Reviewed ptl s/s, fkc. Plan:  Continue routine obstetrical care  F/U in 1wks  for OB appointment and gbs

## 2017-01-27 ENCOUNTER — Encounter: Payer: Self-pay | Admitting: Obstetrics and Gynecology

## 2017-01-27 ENCOUNTER — Ambulatory Visit (INDEPENDENT_AMBULATORY_CARE_PROVIDER_SITE_OTHER): Payer: Medicaid Other | Admitting: Obstetrics and Gynecology

## 2017-01-27 VITALS — BP 140/84 | HR 74 | Wt 308.5 lb

## 2017-01-27 DIAGNOSIS — Z1389 Encounter for screening for other disorder: Secondary | ICD-10-CM

## 2017-01-27 DIAGNOSIS — O98311 Other infections with a predominantly sexual mode of transmission complicating pregnancy, first trimester: Secondary | ICD-10-CM

## 2017-01-27 DIAGNOSIS — Z8759 Personal history of other complications of pregnancy, childbirth and the puerperium: Secondary | ICD-10-CM | POA: Insufficient documentation

## 2017-01-27 DIAGNOSIS — Z3A36 36 weeks gestation of pregnancy: Secondary | ICD-10-CM

## 2017-01-27 DIAGNOSIS — O133 Gestational [pregnancy-induced] hypertension without significant proteinuria, third trimester: Secondary | ICD-10-CM

## 2017-01-27 DIAGNOSIS — O98811 Other maternal infectious and parasitic diseases complicating pregnancy, first trimester: Secondary | ICD-10-CM

## 2017-01-27 DIAGNOSIS — Z3403 Encounter for supervision of normal first pregnancy, third trimester: Secondary | ICD-10-CM

## 2017-01-27 DIAGNOSIS — O2343 Unspecified infection of urinary tract in pregnancy, third trimester: Secondary | ICD-10-CM

## 2017-01-27 DIAGNOSIS — Z331 Pregnant state, incidental: Secondary | ICD-10-CM

## 2017-01-27 DIAGNOSIS — A749 Chlamydial infection, unspecified: Secondary | ICD-10-CM

## 2017-01-27 LAB — POCT URINALYSIS DIPSTICK
Glucose, UA: NEGATIVE
Ketones, UA: NEGATIVE
NITRITE UA: NEGATIVE
Protein, UA: NEGATIVE

## 2017-01-27 LAB — OB RESULTS CONSOLE GBS: STREP GROUP B AG: NEGATIVE

## 2017-01-27 NOTE — Progress Notes (Addendum)
  Amanda BurrowFERGUSON,Amanda Gomm V, MD  G1P0  Estimated Date of Delivery: 02/18/17 LROB 1049w6d  Chief Complaint  Patient presents with  . Routine Prenatal Visit    GBS, GC/CHL; + pressure  ____  Patient denies any HA, blurry or abnormal vision. She notes increased swelling to the hands and feet.  Patient reports   good fetal movement,                           denies any bleeding , rupture of membranes,or regular contractions.  Blood pressure 140/84, pulse 74, weight (!) 308 lb 8 oz (139.9 kg), last menstrual period 05/14/2016.   Urine results:notable for trace blood and 1+ leukocytes  refer to the ob flow sheet for FH and FHR, ,                          Physical Examination: General appearance - alert, well appearing, and in no distress                                      Abdomen - FH 38 cm,                                                         -FHR 143                                                         soft, nontender, nondistended, no masses or organomegaly                                      Pelvic - normal, group B strep collected                                      Reflexes: 1+, no clonus       Questions were answered. Assessment: LROB G1P0 @ 6449w6d                          Gestational hypertension, r/o pre-E  Plan:  Check cbc/ cmet/ Pr/cr ratio urine  F/u in 4 days (Monday) for LROB Work hours limited to 4 hr x 2 d then Maternity disability  F/U 4 d By signing my name below, I, Sonum Patel, attest that this documentation has been prepared under the direction and in the presence of Amanda BurrowJohn Simmie Camerer V, MD. Electronically Signed: Sonum Patel, Scribe. 01/27/17. 10:12 AM.  I personally performed the services described in this documentation, which was SCRIBED in my presence. The recorded information has been reviewed and considered accurate. It has been edited as necessary during review. Amanda BurrowFERGUSON,Amanda Bitner V, MD

## 2017-01-27 NOTE — Addendum Note (Signed)
Addended by: Colen DarlingYOUNG, JANET S on: 01/27/2017 12:31 PM   Modules accepted: Orders

## 2017-01-28 LAB — CBC
HEMOGLOBIN: 11.9 g/dL (ref 11.1–15.9)
Hematocrit: 34.8 % (ref 34.0–46.6)
MCH: 32.1 pg (ref 26.6–33.0)
MCHC: 34.2 g/dL (ref 31.5–35.7)
MCV: 94 fL (ref 79–97)
Platelets: 252 10*3/uL (ref 150–379)
RBC: 3.71 x10E6/uL — AB (ref 3.77–5.28)
RDW: 13.4 % (ref 12.3–15.4)
WBC: 9.8 10*3/uL (ref 3.4–10.8)

## 2017-01-28 LAB — COMPREHENSIVE METABOLIC PANEL
A/G RATIO: 1.6 (ref 1.2–2.2)
ALBUMIN: 3.4 g/dL — AB (ref 3.5–5.5)
ALK PHOS: 169 IU/L — AB (ref 39–117)
ALT: 11 IU/L (ref 0–32)
AST: 13 IU/L (ref 0–40)
BILIRUBIN TOTAL: 0.3 mg/dL (ref 0.0–1.2)
BUN / CREAT RATIO: 8 — AB (ref 9–23)
BUN: 4 mg/dL — AB (ref 6–20)
CHLORIDE: 105 mmol/L (ref 96–106)
CO2: 21 mmol/L (ref 18–29)
Calcium: 8.8 mg/dL (ref 8.7–10.2)
Creatinine, Ser: 0.48 mg/dL — ABNORMAL LOW (ref 0.57–1.00)
GFR calc non Af Amer: 141 mL/min/{1.73_m2} (ref >59)
GFR, EST AFRICAN AMERICAN: 162 mL/min/{1.73_m2} (ref >59)
GLOBULIN, TOTAL: 2.1 g/dL (ref 1.5–4.5)
GLUCOSE: 84 mg/dL (ref 65–99)
POTASSIUM: 4.3 mmol/L (ref 3.5–5.2)
SODIUM: 140 mmol/L (ref 134–144)
TOTAL PROTEIN: 5.5 g/dL — AB (ref 6.0–8.5)

## 2017-01-28 LAB — PROTEIN / CREATININE RATIO, URINE
CREATININE, UR: 63.2 mg/dL
Protein, Ur: 14.5 mg/dL
Protein/Creat Ratio: 229 mg/g creat — ABNORMAL HIGH (ref 0–200)

## 2017-01-29 LAB — GC/CHLAMYDIA PROBE AMP
CHLAMYDIA, DNA PROBE: NEGATIVE
Neisseria gonorrhoeae by PCR: NEGATIVE

## 2017-01-29 LAB — STREP GP B NAA: Strep Gp B NAA: NEGATIVE

## 2017-01-31 ENCOUNTER — Ambulatory Visit (INDEPENDENT_AMBULATORY_CARE_PROVIDER_SITE_OTHER): Payer: Medicaid Other | Admitting: Obstetrics & Gynecology

## 2017-01-31 ENCOUNTER — Encounter: Payer: Self-pay | Admitting: Obstetrics & Gynecology

## 2017-01-31 ENCOUNTER — Inpatient Hospital Stay (HOSPITAL_COMMUNITY)
Admission: AD | Admit: 2017-01-31 | Discharge: 2017-02-03 | DRG: 775 | Disposition: A | Discharge status: Home / Self Care | Payer: Medicaid Other | Source: Ambulatory Visit | Attending: Family Medicine | Admitting: Family Medicine

## 2017-01-31 ENCOUNTER — Encounter (HOSPITAL_COMMUNITY): Payer: Self-pay | Admitting: *Deleted

## 2017-01-31 VITALS — BP 150/96 | HR 96 | Wt 308.0 lb

## 2017-01-31 DIAGNOSIS — O99214 Obesity complicating childbirth: Secondary | ICD-10-CM | POA: Diagnosis present

## 2017-01-31 DIAGNOSIS — E669 Obesity, unspecified: Secondary | ICD-10-CM | POA: Diagnosis present

## 2017-01-31 DIAGNOSIS — O26893 Other specified pregnancy related conditions, third trimester: Secondary | ICD-10-CM | POA: Diagnosis present

## 2017-01-31 DIAGNOSIS — O133 Gestational [pregnancy-induced] hypertension without significant proteinuria, third trimester: Secondary | ICD-10-CM

## 2017-01-31 DIAGNOSIS — Z331 Pregnant state, incidental: Secondary | ICD-10-CM

## 2017-01-31 DIAGNOSIS — Z833 Family history of diabetes mellitus: Secondary | ICD-10-CM | POA: Diagnosis not present

## 2017-01-31 DIAGNOSIS — O134 Gestational [pregnancy-induced] hypertension without significant proteinuria, complicating childbirth: Secondary | ICD-10-CM | POA: Diagnosis present

## 2017-01-31 DIAGNOSIS — Z8249 Family history of ischemic heart disease and other diseases of the circulatory system: Secondary | ICD-10-CM

## 2017-01-31 DIAGNOSIS — A749 Chlamydial infection, unspecified: Secondary | ICD-10-CM

## 2017-01-31 DIAGNOSIS — Z6841 Body Mass Index (BMI) 40.0 and over, adult: Secondary | ICD-10-CM | POA: Diagnosis not present

## 2017-01-31 DIAGNOSIS — Z823 Family history of stroke: Secondary | ICD-10-CM

## 2017-01-31 DIAGNOSIS — Z1389 Encounter for screening for other disorder: Secondary | ICD-10-CM

## 2017-01-31 DIAGNOSIS — Z6791 Unspecified blood type, Rh negative: Secondary | ICD-10-CM

## 2017-01-31 DIAGNOSIS — Z3403 Encounter for supervision of normal first pregnancy, third trimester: Secondary | ICD-10-CM

## 2017-01-31 DIAGNOSIS — O98811 Other maternal infectious and parasitic diseases complicating pregnancy, first trimester: Secondary | ICD-10-CM

## 2017-01-31 DIAGNOSIS — K219 Gastro-esophageal reflux disease without esophagitis: Secondary | ICD-10-CM | POA: Diagnosis present

## 2017-01-31 DIAGNOSIS — O9962 Diseases of the digestive system complicating childbirth: Secondary | ICD-10-CM | POA: Diagnosis present

## 2017-01-31 DIAGNOSIS — O0993 Supervision of high risk pregnancy, unspecified, third trimester: Secondary | ICD-10-CM | POA: Diagnosis not present

## 2017-01-31 DIAGNOSIS — Z3A37 37 weeks gestation of pregnancy: Secondary | ICD-10-CM | POA: Diagnosis not present

## 2017-01-31 LAB — COMPREHENSIVE METABOLIC PANEL
ALBUMIN: 3 g/dL — AB (ref 3.5–5.0)
ALT: 17 U/L (ref 14–54)
ANION GAP: 7 (ref 5–15)
AST: 21 U/L (ref 15–41)
Alkaline Phosphatase: 165 U/L — ABNORMAL HIGH (ref 38–126)
BUN: 6 mg/dL (ref 6–20)
CHLORIDE: 109 mmol/L (ref 101–111)
CO2: 19 mmol/L — ABNORMAL LOW (ref 22–32)
Calcium: 8.6 mg/dL — ABNORMAL LOW (ref 8.9–10.3)
Creatinine, Ser: 0.44 mg/dL (ref 0.44–1.00)
GFR calc Af Amer: >60 mL/min (ref >60)
GFR calc non Af Amer: >60 mL/min (ref >60)
GLUCOSE: 83 mg/dL (ref 65–99)
POTASSIUM: 4.2 mmol/L (ref 3.5–5.1)
Sodium: 135 mmol/L (ref 135–145)
Total Bilirubin: 0.5 mg/dL (ref 0.3–1.2)
Total Protein: 6.2 g/dL — ABNORMAL LOW (ref 6.5–8.1)

## 2017-01-31 LAB — CBC
HCT: 35 % — ABNORMAL LOW (ref 36.0–46.0)
HEMOGLOBIN: 12.1 g/dL (ref 12.0–15.0)
MCH: 32.7 pg (ref 26.0–34.0)
MCHC: 34.6 g/dL (ref 30.0–36.0)
MCV: 94.6 fL (ref 78.0–100.0)
Platelets: 263 10*3/uL (ref 150–400)
RBC: 3.7 MIL/uL — ABNORMAL LOW (ref 3.87–5.11)
RDW: 13.5 % (ref 11.5–15.5)
WBC: 11 10*3/uL — ABNORMAL HIGH (ref 4.0–10.5)

## 2017-01-31 LAB — POCT URINALYSIS DIPSTICK
Blood, UA: NEGATIVE
Glucose, UA: NEGATIVE
KETONES UA: NEGATIVE
NITRITE UA: NEGATIVE
PROTEIN UA: NEGATIVE

## 2017-01-31 LAB — PROTEIN / CREATININE RATIO, URINE
CREATININE, URINE: 78 mg/dL
PROTEIN CREATININE RATIO: 0.17 mg/mg{creat} — AB (ref 0.00–0.15)
Total Protein, Urine: 13 mg/dL

## 2017-01-31 MED ORDER — LACTATED RINGERS IV SOLN
INTRAVENOUS | Status: DC
  Administered 2017-01-31 – 2017-02-01 (×4): via INTRAVENOUS

## 2017-01-31 MED ORDER — LACTATED RINGERS IV SOLN
500.0000 mL | INTRAVENOUS | Status: DC | PRN
  Administered 2017-02-01: 300 mL via INTRAVENOUS

## 2017-01-31 MED ORDER — ONDANSETRON HCL 4 MG/2ML IJ SOLN
4.0000 mg | Freq: Four times a day (QID) | INTRAMUSCULAR | Status: DC | PRN
  Administered 2017-02-01: 4 mg via INTRAVENOUS
  Filled 2017-01-31: qty 2

## 2017-01-31 MED ORDER — ACETAMINOPHEN 325 MG PO TABS
650.0000 mg | ORAL_TABLET | ORAL | Status: DC | PRN
  Administered 2017-02-01: 650 mg via ORAL
  Filled 2017-01-31: qty 2

## 2017-01-31 MED ORDER — TERBUTALINE SULFATE 1 MG/ML IJ SOLN
0.2500 mg | Freq: Once | INTRAMUSCULAR | Status: DC | PRN
  Filled 2017-01-31: qty 1

## 2017-01-31 MED ORDER — SOD CITRATE-CITRIC ACID 500-334 MG/5ML PO SOLN: 30.0000 mL | ORAL | Status: DC | PRN

## 2017-01-31 MED ORDER — OXYCODONE-ACETAMINOPHEN 5-325 MG PO TABS: 2.0000 | ORAL_TABLET | ORAL | Status: DC | PRN

## 2017-01-31 MED ORDER — OXYTOCIN BOLUS FROM INFUSION
500.0000 mL | Freq: Once | INTRAVENOUS | Status: AC
  Administered 2017-02-01: 500 mL via INTRAVENOUS

## 2017-01-31 MED ORDER — OXYCODONE-ACETAMINOPHEN 5-325 MG PO TABS: 1.0000 | ORAL_TABLET | ORAL | Status: DC | PRN

## 2017-01-31 MED ORDER — FENTANYL CITRATE (PF) 100 MCG/2ML IJ SOLN
100.0000 ug | INTRAMUSCULAR | Status: DC | PRN
  Administered 2017-02-01: 100 ug via INTRAVENOUS
  Filled 2017-01-31: qty 2

## 2017-01-31 MED ORDER — MISOPROSTOL 25 MCG QUARTER TABLET
25.0000 ug | ORAL_TABLET | ORAL | Status: DC | PRN
  Administered 2017-01-31: 25 ug via VAGINAL
  Filled 2017-01-31 (×2): qty 1

## 2017-01-31 MED ORDER — OXYTOCIN 40 UNITS IN LACTATED RINGERS INFUSION - SIMPLE MED
2.5000 [IU]/h | INTRAVENOUS | Status: DC
  Filled 2017-01-31: qty 1000

## 2017-01-31 MED ORDER — OXYTOCIN 40 UNITS IN LACTATED RINGERS INFUSION - SIMPLE MED
1.0000 m[IU]/min | INTRAVENOUS | Status: DC
  Administered 2017-02-01: 2 m[IU]/min via INTRAVENOUS

## 2017-01-31 MED ORDER — LIDOCAINE HCL (PF) 1 % IJ SOLN
30.0000 mL | INTRAMUSCULAR | Status: DC | PRN
Start: 2017-01-31 — End: 2017-02-01
  Filled 2017-01-31: qty 30

## 2017-01-31 NOTE — Progress Notes (Signed)
Patient ID: Amanda Black, female   DOB: [birth year], 21 y.o.   MRN: [id number]  S: Patient seen & examined for progress of induction of labor. Patient comfortable not feeling contractions.   O:  Vitals:   01/31/17 1800 01/31/17 1804 01/31/17 1813 01/31/17 1939  BP: 138/72   (!) 142/76  Pulse: 99   92  Resp: 16   16  Temp:   97.3 F (36.3 C) 97.7 F (36.5 C)  TempSrc:   Oral Oral  Weight:  (!) 308 lb (139.7 kg)    Height:   (1.702 m)      Dilation: 1 Effacement (%): Thick Cervical Position: Posterior Station: -2 Presentation: Vertex Exam by:: Mavrik Bynum   FHT: 140 bpm, mod var, +accels, no decels TOCO: q3-21min  FB placed with ease. Patient tolerated procedure well.  A/P: FB placed Continue cytotec, but if too frequent contractions will proceed with pitocin Continue expectant management Anticipate SVD

## 2017-01-31 NOTE — Anesthesia Pain Management Evaluation Note (Signed)
  [facility] Pain Management Visit Note  Patient: Amanda Black, 22 y.o., female  "Hello I am a member of the anesthesia team at [facility]. We have an anesthesia team available at all times to provide care throughout the hospital, including epidural management and anesthesia for C-section. I don't know your plan for the delivery whether it a natural birth, water birth, IV sedation, nitrous supplementation, doula or epidural, but we want to meet your pain goals."   1.Was your pain managed to your expectations on prior hospitalizations?   No prior hospitalizations  2.What is your expectation for pain management during this hospitalization?     IV pain meds  3.How can we help you reach that goal?   Record the patient's initial score and the patient's pain goal.   Pain: 0  Pain Goal: 8 [facility] wants you to be able to say your pain was always managed very well.  Laban Emperor 01/31/2017

## 2017-01-31 NOTE — Progress Notes (Signed)
Fetal Surveillance Testing today:  FHR 152   High Risk Pregnancy Diagnosis(es):   Gestational Hypertension  G1P0 [redacted]w[redacted]d Estimated Date of Delivery: 02/18/17  Blood pressure (!) 150/96, pulse 96, weight (!) 308 lb (139.7 kg), last menstrual period 05/14/2016.  Urinalysis: Negative   HPI: The patient is being seen today for ongoing management of as bove. Today she reports no headache or cns symptoms   BP weight and urine results all reviewed and noted. Patient reports good fetal movement, denies any bleeding and no rupture of membranes symptoms or regular contractions.  Fundal Height:  39 Fetal Heart rate:  152 Edema:  1+  Patient is without complaints other than noted in her HPI. All questions were answered.  All lab and sonogram results have been reviewed. Comments:    Assessment:  1.  Pregnancy at [redacted]w[redacted]d,  Estimated Date of Delivery: 02/18/17 :                          2.  Gestational Hypertension                        3.    Medication(s) Plans:    Treatment Plan:  Cervical ripening and induction of labor, discussed with Dr Debroah Loop and Dr Gerarda Gunther  Return in about 10 days (around 02/10/2017) for BP check. for appointment for high risk OB care  No orders of the defined types were placed in this encounter.  Orders Placed This Encounter  Procedures  . POCT urinalysis dipstick

## 2017-01-31 NOTE — Progress Notes (Signed)
Labor Progress Note Amanda Black is a 21 y.o. G1P0 at [redacted]w[redacted]d presented for IOL 2/2 GHTN S: reports contraction. Denies headache or vision changes. No other complaints  O:  BP 120/71   Pulse 84   Temp 98 F (36.7 C) (Oral)   Resp 18   Ht  (1.702 m)   Wt (!) 308 lb (139.7 kg)   LMP 05/14/2016 (Exact Date)   BMI 48.24 kg/m  EFM: 140/mod var/no decels  CVE: Dilation: 4 Effacement (%): 80 Cervical Position: Posterior Station: -2 Presentation: Vertex Exam by:: AThersa Salt RN    A&P: 21 y.o. G1P0 [redacted]w[redacted]d here for IOL for GHTN #GHTN: BP within normal limit so far #Labor: foley bulb out. Start pitocin.  #Pain: epidural when requested #FWB: CAT-1 #GBS: negative  Almon Hercules, MD 11:59 PM

## 2017-01-31 NOTE — H&P (Signed)
LABOR AND DELIVERY ADMISSION HISTORY AND PHYSICAL NOTE  Amanda Black is a 21 y.o. female G1P0 with IUP at [redacted]w[redacted]d by [place] presenting for IOL 2/2 GHTN.   Patient was seen in office last week, had an elevated BP, then had a follow up visit today in office, was elevated again, therefore was sent in to be induced for [facility]. She denies any HA, changes in vision, RUQ/epigastric pain, CP/SOB.   She reports positive fetal movement. She denies leakage of fluid or vaginal bleeding.  Prenatal History/Complications:  Past Medical History: Past Medical History:  Diagnosis Date  . Breast lump    left breast  . GERD without esophagitis     Past Surgical History: Past Surgical History:  Procedure Laterality Date  . TONSILLECTOMY AND ADENOIDECTOMY      Obstetrical History: OB History    Gravida Para Term Preterm AB Living   1             SAB TAB Ectopic Multiple Live Births                  Social History: Social History   Social History  . Marital status: Single    Spouse name: N/A  . Number of children: N/A  . Years of education: N/A   Social History Main Topics  . Smoking status: Never Smoker  . Smokeless tobacco: Never Used  . Alcohol use No  . Drug use: No  . Sexual activity: Yes    Birth control/ protection: None   Other Topics Concern  . None   Social History Narrative  . None    Family History: Family History  Problem Relation Age of Onset  . Hypertension Mother   . Anemia Mother   . Diabetes Other   . Kidney Stones Other   . Hypertension Maternal Grandmother   . Diabetes Maternal Grandmother   . Pancreatitis Maternal Grandmother   . Cholecystitis Maternal Grandmother   . Prostate cancer Maternal Grandfather   . Heart disease Maternal Grandfather   . Stroke Maternal Grandfather     Allergies: No Known Allergies  Prescriptions Prior to Admission  Medication Sig Dispense Refill Last Dose  . Prenatal Vit-Fe Fumarate-FA (MULTIVITAMIN-PRENATAL) 27-0.8 MG  TABS tablet Take 1 tablet by mouth daily at 12 noon.   Taking  . ranitidine (ZANTAC) 150 MG tablet Take 150 mg by mouth 2 (two) times daily.   Taking     Review of Systems   All systems reviewed and negative except as stated in HPI  Blood pressure 138/72, pulse 99, temperature 97.3 F (36.3 C), temperature source Oral, resp. rate 16, height  (1.702 m), weight (!) 308 lb (139.7 kg), last menstrual period 05/14/2016. General appearance: alert, cooperative and appears stated age Lungs: Normal effort, no audible wheezing Heart: regular rate, pulses normal Abdomen: soft, non-tender Extremities: No calf swelling or tenderness Presentation: cephalic by nurse exam Fetal monitoring: FHR 150, intermittent accel, few decel  Uterine activity:   Dilation: 1 Effacement (%): 50 Station: -2 Exam by:: lee   Prenatal labs: ABO, Rh: --/--/B NEG (04/02 1805) Antibody: POS (04/02 1805) Rubella: Immune RPR: Non Reactive (01/26 0908)  HBsAg: Negative (09/20 1233)  HIV: Non Reactive (01/26 0908)  GBS: Negative (03/29 1400)  1 hr Glucola: 85 Genetic screening: Normal Anatomy US: Normal female  Prenatal Transfer Tool  Maternal Diabetes: No Genetic Screening: Normal Maternal Ultrasounds/Referrals: Normal Fetal Ultrasounds or other Referrals:  None Maternal Substance Abuse:  No Significant Maternal  Medications:  None Significant Maternal Lab Results: None  Results for orders placed or performed during the hospital encounter of 01/31/17 (from the past 24 hour(s))  CBC   Collection Time: 01/31/17  6:05 PM  Result Value Ref Range   WBC 11.0 (H) 4.0 - 10.5 K/uL   RBC 3.70 (L) 3.87 - 5.11 MIL/uL   Hemoglobin 12.1 12.0 - 15.0 g/dL   HCT 16.1 (L) 09.6 - 04.5 %   MCV 94.6 78.0 - 100.0 fL   MCH 32.7 26.0 - 34.0 pg   MCHC 34.6 30.0 - 36.0 g/dL   RDW 40.9 81.1 - 91.4 %   Platelets 263 150 - 400 K/uL  Comprehensive metabolic panel   Collection Time: 01/31/17  6:05 PM  Result Value Ref Range    Sodium 135 135 - 145 mmol/L   Potassium 4.2 3.5 - 5.1 mmol/L   Chloride 109 101 - 111 mmol/L   CO2 19 (L) 22 - 32 mmol/L   Glucose, Bld 83 65 - 99 mg/dL   BUN 6 6 - 20 mg/dL   Creatinine, Ser 7.82 0.44 - 1.00 mg/dL   Calcium 8.6 (L) 8.9 - 10.3 mg/dL   Total Protein 6.2 (L) 6.5 - 8.1 g/dL   Albumin 3.0 (L) 3.5 - 5.0 g/dL   AST 21 15 - 41 U/L   ALT 17 14 - 54 U/L   Alkaline Phosphatase 165 (H) 38 - 126 U/L   Total Bilirubin 0.5 0.3 - 1.2 mg/dL   GFR calc non Af Amer >60 >60 mL/min   GFR calc Af Amer >60 >60 mL/min   Anion gap 7 5 - 15  Type and screen [facility]   Collection Time: 01/31/17  6:05 PM  Result Value Ref Range   ABO/RH(D) B NEG    Antibody Screen POS    Sample Expiration 02/03/2017   Results for orders placed or performed in visit on 01/31/17 (from the past 24 hour(s))  POCT urinalysis dipstick   Collection Time: 01/31/17 10:17 AM  Result Value Ref Range   Color, UA     Clarity, UA     Glucose, UA neg    Bilirubin, UA     Ketones, UA neg    Spec Grav, UA  1.030 - 1.035   Blood, UA neg    pH, UA  5.0 - 8.0   Protein, UA neg    Urobilinogen, UA  Negative - 2.0   Nitrite, UA neg    Leukocytes, UA moderate (2+) (A) Negative    Patient Active Problem List   Diagnosis Date Noted  . Gestational (pregnancy-induced) hypertension without significant proteinuria, complicating childbirth 01/31/2017  . Gestational hypertension 01/27/2017  . UTI (urinary tract infection) during pregnancy, third trimester 11/26/2016  . Rh negative state in antepartum period 10/27/2016  . Chlamydia infection affecting pregnancy in first trimester 07/26/2016  . Supervision of normal first pregnancy 07/21/2016    Assessment: Amanda Black is a 21 y.o. G1P0 at [redacted]w[redacted]d here for IOL 2/2 GHTN  #Labor: Induction w/ cytotec, plan for foley bulb  #Pain:  Decline epidural #FWB: Category 1 #ID:  GBS negative #MOF: Breast #MOC: POP #Circ:  N/A #GHTN, labs negative,  PR/CR pending monitor BP  Amanda Black 01/31/2017, 7:28 PM   OB FELLOW HISTORY AND PHYSICAL ATTESTATION  I have seen and examined this patient; I agree with above documentation in the resident's note. She has no signs or symptoms of preeclampsia. FB was placed, continue with  cytotec then pitocin.    Jen Mow, DO OB Fellow 01/31/2017, 9:43 PM

## 2017-02-01 ENCOUNTER — Inpatient Hospital Stay (HOSPITAL_COMMUNITY): Payer: Medicaid Other | Admitting: Anesthesiology

## 2017-02-01 ENCOUNTER — Encounter (HOSPITAL_COMMUNITY): Payer: Self-pay

## 2017-02-01 DIAGNOSIS — O134 Gestational [pregnancy-induced] hypertension without significant proteinuria, complicating childbirth: Secondary | ICD-10-CM

## 2017-02-01 DIAGNOSIS — Z3A37 37 weeks gestation of pregnancy: Secondary | ICD-10-CM

## 2017-02-01 LAB — RPR: RPR Ser Ql: NONREACTIVE

## 2017-02-01 LAB — CBC
HEMATOCRIT: 34.3 % — AB (ref 36.0–46.0)
Hemoglobin: 11.7 g/dL — ABNORMAL LOW (ref 12.0–15.0)
MCH: 32.4 pg (ref 26.0–34.0)
MCHC: 34.1 g/dL (ref 30.0–36.0)
MCV: 95 fL (ref 78.0–100.0)
Platelets: 241 10*3/uL (ref 150–400)
RBC: 3.61 MIL/uL — ABNORMAL LOW (ref 3.87–5.11)
RDW: 13.7 % (ref 11.5–15.5)
WBC: 15 10*3/uL — AB (ref 4.0–10.5)

## 2017-02-01 MED ORDER — PRENATAL MULTIVITAMIN CH
1.0000 | ORAL_TABLET | Freq: Every day | ORAL | Status: DC
  Administered 2017-02-02 – 2017-02-03 (×2): 1 via ORAL
  Filled 2017-02-01 (×2): qty 1

## 2017-02-01 MED ORDER — GENTAMICIN SULFATE 40 MG/ML IJ SOLN
220.0000 mg | Freq: Three times a day (TID) | INTRAMUSCULAR | Status: DC
  Administered 2017-02-01: 220 mg via INTRAVENOUS
  Filled 2017-02-01 (×3): qty 5.5

## 2017-02-01 MED ORDER — SODIUM CHLORIDE 0.9 % IV SOLN
2.0000 g | Freq: Four times a day (QID) | INTRAVENOUS | Status: AC
  Administered 2017-02-01: 2 g via INTRAVENOUS
  Filled 2017-02-01: qty 2000

## 2017-02-01 MED ORDER — SENNOSIDES-DOCUSATE SODIUM 8.6-50 MG PO TABS
2.0000 | ORAL_TABLET | ORAL | Status: DC
  Administered 2017-02-01 – 2017-02-03 (×2): 2 via ORAL
  Filled 2017-02-01 (×2): qty 2

## 2017-02-01 MED ORDER — SIMETHICONE 80 MG PO CHEW: 80.0000 mg | CHEWABLE_TABLET | ORAL | Status: DC | PRN

## 2017-02-01 MED ORDER — COCONUT OIL OIL: 1.0000 "application " | TOPICAL_OIL | Status: DC | PRN

## 2017-02-01 MED ORDER — FENTANYL 2.5 MCG/ML BUPIVACAINE 1/10 % EPIDURAL INFUSION (WH - ANES)
10.0000 mL/h | INTRAMUSCULAR | Status: DC | PRN
  Administered 2017-02-01: 14 mL/h via EPIDURAL
  Filled 2017-02-01: qty 100

## 2017-02-01 MED ORDER — ONDANSETRON HCL 4 MG PO TABS: 4.0000 mg | ORAL_TABLET | ORAL | Status: DC | PRN

## 2017-02-01 MED ORDER — BENZOCAINE-MENTHOL 20-0.5 % EX AERO
1.0000 | INHALATION_SPRAY | CUTANEOUS | Status: DC | PRN
Start: 2017-02-01 — End: 2017-02-03
  Administered 2017-02-01 – 2017-02-03 (×2): 1 via TOPICAL
  Filled 2017-02-01 (×2): qty 56

## 2017-02-01 MED ORDER — FENTANYL 2.5 MCG/ML BUPIVACAINE 1/10 % EPIDURAL INFUSION (WH - ANES): 14.0000 mL/h | INTRAMUSCULAR | Status: DC | PRN

## 2017-02-01 MED ORDER — DIBUCAINE 1 % RE OINT
1.0000 | TOPICAL_OINTMENT | RECTAL | Status: DC | PRN
Start: 2017-02-01 — End: 2017-02-03

## 2017-02-01 MED ORDER — DIPHENHYDRAMINE HCL 50 MG/ML IJ SOLN: 12.5000 mg | INTRAMUSCULAR | Status: DC | PRN

## 2017-02-01 MED ORDER — DIPHENHYDRAMINE HCL 25 MG PO CAPS: 25.0000 mg | ORAL_CAPSULE | Freq: Four times a day (QID) | ORAL | Status: DC | PRN

## 2017-02-01 MED ORDER — EPHEDRINE 5 MG/ML INJ
10.0000 mg | INTRAVENOUS | Status: DC | PRN
  Filled 2017-02-01: qty 2

## 2017-02-01 MED ORDER — LACTATED RINGERS IV SOLN
500.0000 mL | Freq: Once | INTRAVENOUS | Status: AC
  Administered 2017-02-01: 500 mL via INTRAVENOUS

## 2017-02-01 MED ORDER — LACTATED RINGERS IV SOLN: 500.0000 mL | Freq: Once | INTRAVENOUS | Status: DC

## 2017-02-01 MED ORDER — WITCH HAZEL-GLYCERIN EX PADS
1.0000 | MEDICATED_PAD | CUTANEOUS | Status: DC | PRN
Start: 2017-02-01 — End: 2017-02-03

## 2017-02-01 MED ORDER — ONDANSETRON HCL 4 MG/2ML IJ SOLN: 4.0000 mg | INTRAMUSCULAR | Status: DC | PRN

## 2017-02-01 MED ORDER — IBUPROFEN 600 MG PO TABS
600.0000 mg | ORAL_TABLET | Freq: Four times a day (QID) | ORAL | Status: DC
  Administered 2017-02-01 – 2017-02-03 (×8): 600 mg via ORAL
  Filled 2017-02-01 (×8): qty 1

## 2017-02-01 MED ORDER — EPHEDRINE 5 MG/ML INJ: 10.0000 mg | INTRAVENOUS | Status: DC | PRN

## 2017-02-01 MED ORDER — LIDOCAINE HCL (PF) 1 % IJ SOLN
INTRAMUSCULAR | Status: DC | PRN
  Administered 2017-02-01: 4 mL via EPIDURAL

## 2017-02-01 MED ORDER — ACETAMINOPHEN 325 MG PO TABS: 650.0000 mg | ORAL_TABLET | ORAL | Status: DC | PRN

## 2017-02-01 MED ORDER — PHENYLEPHRINE 40 MCG/ML (10ML) SYRINGE FOR IV PUSH (FOR BLOOD PRESSURE SUPPORT)
80.0000 ug | PREFILLED_SYRINGE | INTRAVENOUS | Status: DC | PRN
  Filled 2017-02-01: qty 10
  Filled 2017-02-01: qty 5

## 2017-02-01 MED ORDER — PHENYLEPHRINE 40 MCG/ML (10ML) SYRINGE FOR IV PUSH (FOR BLOOD PRESSURE SUPPORT)
80.0000 ug | PREFILLED_SYRINGE | INTRAVENOUS | Status: DC | PRN
  Filled 2017-02-01: qty 5

## 2017-02-01 MED ORDER — PHENYLEPHRINE 40 MCG/ML (10ML) SYRINGE FOR IV PUSH (FOR BLOOD PRESSURE SUPPORT): 80.0000 ug | PREFILLED_SYRINGE | INTRAVENOUS | Status: DC | PRN

## 2017-02-01 MED ORDER — ZOLPIDEM TARTRATE 5 MG PO TABS: 5.0000 mg | ORAL_TABLET | Freq: Every evening | ORAL | Status: DC | PRN

## 2017-02-01 MED ORDER — TETANUS-DIPHTH-ACELL PERTUSSIS 5-2.5-18.5 LF-MCG/0.5 IM SUSP
0.5000 mL | Freq: Once | INTRAMUSCULAR | Status: AC
  Administered 2017-02-01: 0.5 mL via INTRAMUSCULAR
  Filled 2017-02-01: qty 0.5

## 2017-02-01 MED ORDER — GENTAMICIN SULFATE 40 MG/ML IJ SOLN
220.0000 mg | Freq: Three times a day (TID) | INTRAVENOUS | Status: AC
  Administered 2017-02-01: 220 mg via INTRAVENOUS
  Filled 2017-02-01: qty 5.5

## 2017-02-01 MED ORDER — AMPICILLIN SODIUM 2 G IJ SOLR
2.0000 g | Freq: Four times a day (QID) | INTRAMUSCULAR | Status: DC
  Administered 2017-02-01: 2 g via INTRAVENOUS
  Filled 2017-02-01 (×4): qty 2000

## 2017-02-01 NOTE — Progress Notes (Signed)
Patient ID: Amanda Black, female   DOB: 04/02/1996, 21 y.o.   MRN: [id number]  S: Patient seen & examined for progress of labor. Patient comfortable with epidural. Patient feeling feverish, had some N/V earlier that resolved after Zofran.     O:  Vitals:   02/01/17 0657 02/01/17 0659 02/01/17 0702 02/01/17 0704  BP: (!) 142/80  (!) 144/60 (!) 139/97  Pulse: (!) 117  (!) 111 (!) 118  Resp: Temp:  100.2 F (37.9 C)    TempSrc:  Axillary    Weight:      Height:        Dilation: 4 Effacement (%): 60, 70 Cervical Position: Posterior Station: -2 Presentation: Vertex Exam by:: Dr. Omer Jack  FWB: Cat II to III   A/P: Stop pitocin Start Amp/Gent IVF bolus given Elevated BPs, asymptomatic, preE labs were WNL last night Continue expectant management Anticipate SVD

## 2017-02-01 NOTE — Progress Notes (Signed)
Labor Progress Note Amanda Black is a 21 y.o. G1P0 at [redacted]w[redacted]d presented for IOL for GHTN S: She continues to reports contractions. Denies headache or vision changes  O:  BP 127/67   Pulse (!) 101   Temp 97.9 F (36.6 C) (Oral)   Resp 18   Ht  (1.702 m)   Wt (!) 308 lb (139.7 kg)   LMP 05/14/2016 (Exact Date)   BMI 48.24 kg/m  EFM: 4.5/70/-1  CVE: Dilation: 4 Effacement (%): 70 Cervical Position: Posterior Station: -1, -2 Presentation: Vertex Exam by:: Dr. Alanda Slim   A&P: 21 y.o. G1P0 [redacted]w[redacted]d here for IOL for GHTN #GHTN: BP within normal limit so far #Labor: AROM with clear fluid. Continue pitocin.  #Pain: epidural when requested #FWB: CAT-1 #GBS: negative  Almon Hercules, MD 4:36 AM

## 2017-02-01 NOTE — Anesthesia Preprocedure Evaluation (Signed)
Anesthesia Evaluation  Patient identified by MRN, date of birth, ID band Patient awake    Reviewed: Allergy & Precautions, Patient's Chart, lab work & pertinent test results  Airway Mallampati: III       Dental  (+) Teeth Intact   Pulmonary neg pulmonary ROS,    breath sounds clear to auscultation       Cardiovascular hypertension,  Rhythm:Regular Rate:Normal     Neuro/Psych negative neurological ROS  negative psych ROS   GI/Hepatic Neg liver ROS, GERD  Medicated,  Endo/Other  negative endocrine ROS  Renal/GU negative Renal ROS  negative genitourinary   Musculoskeletal negative musculoskeletal ROS (+)   Abdominal   Peds negative pediatric ROS (+)  Hematology negative hematology ROS (+)   Anesthesia Other Findings   Reproductive/Obstetrics (+) Pregnancy                             Lab Results  Component Value Date   WBC 15.0 (H) 02/01/2017   HGB 11.7 (L) 02/01/2017   HCT 34.3 (L) 02/01/2017   MCV 95.0 02/01/2017   PLT 241 02/01/2017     Anesthesia Physical Anesthesia Plan  ASA: III  Anesthesia Plan: Epidural   Post-op Pain Management:    Induction:   Airway Management Planned:   Additional Equipment:   Intra-op Plan:   Post-operative Plan:   Informed Consent: I have reviewed the patients History and Physical, chart, labs and discussed the procedure including the risks, benefits and alternatives for the proposed anesthesia with the patient or authorized representative who has indicated his/her understanding and acceptance.     Plan Discussed with:   Anesthesia Plan Comments:         Anesthesia Quick Evaluation

## 2017-02-01 NOTE — Progress Notes (Signed)
ANTIBIOTIC CONSULT NOTE - INITIAL  Pharmacy Consult for Gentamicin Indication: Triple I  No Known Allergies  Patient Measurements: Height:  (170.2 cm) Weight: (!) 308 lb (139.7 kg) IBW/kg (Calculated) : 61.6 Adjusted Body Weight: 85  Vital Signs: Temp: 100.2 F (37.9 C) (04/03 0659) Temp Source: Axillary (04/03 0659) BP: 135/66 (04/03 0715) Pulse Rate: 115 (04/03 0715)  Labs:  Recent Labs  01/31/17 1805 01/31/17 2300 02/01/17 0609  WBC 11.0*  --  15.0*  HGB 12.1  --  11.7*  PLT 263  --  241  LABCREA  --  78.00  --   CREATININE 0.44  --   --    No results for input(s): GENTTROUGH, GENTPEAK, GENTRANDOM in the last 72 hours.   Microbiology: Recent Results (from the past 720 hour(s))  OB RESULT CONSOLE Group B Strep     Status: None   Collection Time: 01/27/17 12:00 AM  Result Value Ref Range Status   GBS Negative  Final  Strep Gp B NAA     Status: None   Collection Time: 01/27/17  2:00 PM  Result Value Ref Range Status   Strep Gp B NAA Negative Negative Final    Comment: Centers for Disease Control and Prevention (CDC) and American Congress of Obstetricians and Gynecologists (ACOG) guidelines for prevention of perinatal group B streptococcal (GBS) disease specify co-collection of a vaginal and rectal swab specimen to maximize sensitivity of GBS detection. Per the CDC and ACOG, swabbing both the lower vagina and rectum substantially increases the yield of detection compared with sampling the vagina alone. Penicillin G, ampicillin, or cefazolin are indicated for intrapartum prophylaxis of perinatal GBS colonization. Reflex susceptibility testing should be performed prior to use of clindamycin only on GBS isolates from penicillin-allergic women who are considered a high risk for anaphylaxis. Treatment with vancomycin without additional testing is warranted if resistance to clindamycin is noted.   GC/Chlamydia Probe Amp     Status: None   Collection Time:  01/27/17  2:00 PM  Result Value Ref Range Status   Chlamydia trachomatis, NAA Negative Negative Final   Neisseria gonorrhoeae by PCR Negative Negative Final    Medications:  Ampicillin 2 grams IV every 6 hours  Assessment: 21 y.o. female G1P0 at [redacted]w[redacted]d with maternal fever Estimated Ke = 0.508, Vd = 0.38 L/kg  Goal of Therapy:  Gentamicin peak 6-8 mg/L and Trough < 1 mg/L  Plan:  Gentamicin 220 mg IV every 8 hrs  Will check gentamicin levels if continued > 72hr or clinically indicated.  Berlin Hun D 02/01/2017,7:30 AM

## 2017-02-01 NOTE — Progress Notes (Deleted)
Amanda Black 20 yo G1 now P1 admitted in for IOL 2/2 GHTN. Augmented with AROM.  Delivery Note At 12:54 PM a viable female was delivered via  (Presentation:LOA ; vertex ).  APGAR:8, 9 ; weight pending.   Placenta status: delivered intact with gentle traction , .  Cord: 3 vessels cord with the following complications: None .  Cord pH: Not collected  Anesthesia:  Epidural Episiotomy:  N/A Lacerations: 1st degree tear left labia, hemostatic no repair needed Est. Blood Loss (mL): 150ml  Mom to postpartum.  Baby to Couplet care / Skin to Skin.  Amanda Black 02/01/2017, 1:09 PM  

## 2017-02-01 NOTE — Anesthesia Procedure Notes (Signed)
Epidural Patient location during procedure: OB Start time: 02/01/2017 6:41 AM End time: 02/01/2017 6:47 AM  Staffing Anesthesiologist: Shona Simpson D Performed: anesthesiologist   Preanesthetic Checklist Completed: patient identified, site marked, surgical consent, pre-op evaluation, timeout performed, IV checked, risks and benefits discussed and monitors and equipment checked  Epidural Patient position: sitting Prep: ChloraPrep Patient monitoring: heart rate, continuous pulse ox and blood pressure Approach: midline Location: L3-L4 Injection technique: LOR saline  Needle:  Needle type: Tuohy  Needle gauge: 17 G Needle length: 9 cm Catheter type: closed end flexible Catheter size: 20 Guage Test dose: negative and 1.5% lidocaine  Assessment Events: blood not aspirated, injection not painful, no injection resistance and no paresthesia  Additional Notes LOR @ 9.5  Patient identified. Risks/Benefits/Options discussed with patient including but not limited to bleeding, infection, nerve damage, paralysis, failed block, incomplete pain control, headache, blood pressure changes, nausea, vomiting, reactions to medications, itching and postpartum back pain. Confirmed with bedside nurse the patient's most recent platelet count. Confirmed with patient that they are not currently taking any anticoagulation, have any bleeding history or any family history of bleeding disorders. Patient expressed understanding and wished to proceed. All questions were answered. Sterile technique was used throughout the entire procedure. Please see nursing notes for vital signs. Test dose was given through epidural catheter and negative prior to continuing to dose epidural or start infusion. Warning signs of high block given to the patient including shortness of breath, tingling/numbness in hands, complete motor block, or any concerning symptoms with instructions to call for help. Patient was given instructions on fall  risk and not to get out of bed. All questions and concerns addressed with instructions to call with any issues or inadequate analgesia.    Reason for block:procedure for pain

## 2017-02-01 NOTE — Anesthesia Postprocedure Evaluation (Signed)
Anesthesia Post Note  Patient: Amanda Black  Procedure(s) Performed: * No procedures listed *  Patient location during evaluation: Mother Baby Anesthesia Type: Epidural Level of consciousness: awake and alert and oriented Pain management: pain level controlled Vital Signs Assessment: post-procedure vital signs reviewed and stable Respiratory status: spontaneous breathing and nonlabored ventilation Cardiovascular status: stable Postop Assessment: no headache, patient able to bend at knees, no backache, no signs of nausea or vomiting, epidural receding and adequate PO intake Anesthetic complications: no        Last Vitals:  Vitals:   02/01/17 1440 02/01/17 1540  BP: 127/68 138/68  Pulse: (!) 105 (!) 104  Resp: 20 18  Temp: 37.8 C 36.8 C    Last Pain:  Vitals:   02/01/17 1540  TempSrc: Oral  PainSc: 0-No pain   Pain Goal:                 [place]

## 2017-02-02 MED ORDER — RHO D IMMUNE GLOBULIN 1500 UNIT/2ML IJ SOSY
300.0000 ug | PREFILLED_SYRINGE | Freq: Once | INTRAMUSCULAR | Status: AC
  Administered 2017-02-02: 300 ug via INTRAVENOUS
  Filled 2017-02-02: qty 2

## 2017-02-02 NOTE — Progress Notes (Signed)
Post Partum Day #1 Subjective: no complaints, up ad lib, voiding, tolerating PO and reports normal lochia  Objective: Blood pressure 122/65, pulse 95, temperature 97.7 F (36.5 C), temperature source Oral, resp. rate 16, height  (1.702 m), weight (!) 139.7 kg (308 lb), last menstrual period 05/14/2016, SpO2 98 %, unknown if currently breastfeeding.  Physical Exam:  General: alert Lochia: appropriate Uterine Fundus: firm at U-1 DVT Evaluation: No evidence of DVT seen on physical exam.   Recent Labs  01/31/17 1805 02/01/17 0609  HGB 12.1 11.7*  HCT 35.0* 34.3*    Assessment/Plan: Plan for discharge tomorrow   LOS: 2 days   Amanda Black 02/02/2017, 7:05 AM

## 2017-02-03 LAB — RH IG WORKUP (INCLUDES ABO/RH)
ABO/RH(D): B NEG
FETAL SCREEN: NEGATIVE
GESTATIONAL AGE(WKS): 37.4
Unit division: 0

## 2017-02-03 MED ORDER — IBUPROFEN 600 MG PO TABS: 600.0000 mg | ORAL_TABLET | Freq: Four times a day (QID) | ORAL | 0 refills | Status: DC

## 2017-02-03 NOTE — Discharge Summary (Signed)
OB Discharge Summary  Patient Name: Amanda Black DOB: 1996-05-01 MRN: [id number]  Date of admission: 01/31/2017 Delivering MD: Lovena Neighbours   Date of discharge: 02/03/2017  Admitting diagnosis: INDUCTION Intrauterine pregnancy: [redacted]w[redacted]d     Secondary diagnosis:Active Problems:   Gestational (pregnancy-induced) hypertension without significant proteinuria, complicating childbirth  Additional problems:obesity     Discharge diagnosis: Preterm Pregnancy Delivered                                                                      Augmentation: AROM, Pitocin, Cytotec and Foley Balloon  Complications: None  Hospital course:  Induction of Labor With Vaginal Delivery   21 y.o. yo G1P1001 at [redacted]w[redacted]d was admitted to the hospital 01/31/2017 for induction of labor.  Indication for induction: Gestational hypertension.  Patient had an uncomplicated labor course as follows: Membrane Rupture Time/Date: 6:52 AM ,02/01/2017   Intrapartum Procedures: Episiotomy: None [1]                                         Lacerations:  Labial [10];1st degree [2]  Patient had delivery of a Viable infant.  Information for the patient's newborn:  Eavan, Gonterman [[id number]]  Delivery Method: Vaginal, Spontaneous Delivery (Filed from Delivery Summary)   02/01/2017  Details of delivery can be found in separate delivery note.  Patient had a routine postpartum course. Patient is discharged home 02/03/17.  Physical exam  Vitals:   02/01/17 1949 02/02/17 0351 02/02/17 1900 02/03/17 0635  BP: 127/67 122/65 123/66 (!) 133/93  Pulse: 96 95 78 88  Resp: Temp: 98.7 F (37.1 C) 97.7 F (36.5 C) 97.7 F (36.5 C) 97.7 F (36.5 C)  TempSrc: Oral Oral Oral Oral  SpO2: 98%   100%  Weight:      Height:       General: alert Lochia: appropriate Uterine Fundus: firm Incision: N/A DVT Evaluation: No evidence of DVT seen on physical exam. Labs: Lab Results  Component Value Date   WBC 15.0 (H)  02/01/2017   HGB 11.7 (L) 02/01/2017   HCT 34.3 (L) 02/01/2017   MCV 95.0 02/01/2017   PLT 241 02/01/2017   CMP Latest Ref Rng & Units 01/31/2017  Glucose 65 - 99 mg/dL 83  BUN 6 - 20 mg/dL 6  Creatinine 7.82 - 9.56 mg/dL 2.13  Sodium 086 - 578 mmol/L 135  Potassium 3.5 - 5.1 mmol/L 4.2  Chloride 101 - 111 mmol/L 109  CO2 22 - 32 mmol/L 19(L)  Calcium 8.9 - 10.3 mg/dL 4.6(N)  Total Protein 6.5 - 8.1 g/dL 6.2(L)  Total Bilirubin 0.3 - 1.2 mg/dL 0.5  Alkaline Phos 38 - 126 U/L 165(H)  AST 15 - 41 U/L 21  ALT 14 - 54 U/L 17    Discharge instruction: per After Visit Summary and "Baby and Me Booklet".  After Visit Meds:  Allergies as of 02/03/2017   No Known Allergies     Medication List    TAKE these medications   ibuprofen 600 MG tablet Commonly known as:  ADVIL,MOTRIN Take 1 tablet (600 mg total) by mouth  every 6 (six) hours.   multivitamin-prenatal 27-0.8 MG Tabs tablet Take 1 tablet by mouth daily at 12 noon.   ranitidine 150 MG tablet Commonly known as:  ZANTAC Take 150 mg by mouth 2 (two) times daily.       Diet: routine diet  Activity: Advance as tolerated. Pelvic rest for 6 weeks.   Outpatient follow up:1 week for BP check and 6 weeks for PP visit Follow up Appt:Future Appointments Date Time Provider Department Center  03/16/2017 10:00 AM Cheral Marker, CNM [facility]   Follow up visit: No Follow-up on file.  Postpartum contraception: Abstinence  Newborn Data: Live born female  Birth Weight: 6 lb 15.2 oz (3152 g) APGAR: 8, 9  Baby Feeding: Bottle Disposition:home with mother   02/03/2017 Allie Bossier, MD

## 2017-02-03 NOTE — Discharge Instructions (Signed)

## 2017-02-04 LAB — TYPE AND SCREEN
ABO/RH(D): B NEG
Antibody Screen: POSITIVE
DAT, IgG: NEGATIVE
UNIT DIVISION: 0
UNIT DIVISION: 0

## 2017-02-04 LAB — BPAM RBC
BLOOD PRODUCT EXPIRATION DATE: 201804222359
Blood Product Expiration Date: 201805072359
Unit Type and Rh: 9500
Unit Type and Rh: 9500

## 2017-02-09 ENCOUNTER — Encounter: Payer: Medicaid Other | Admitting: Advanced Practice Midwife

## 2017-02-10 ENCOUNTER — Encounter: Payer: Medicaid Other | Admitting: Obstetrics and Gynecology

## 2017-02-14 ENCOUNTER — Encounter: Payer: Self-pay | Admitting: Women's Health

## 2017-02-14 ENCOUNTER — Ambulatory Visit (INDEPENDENT_AMBULATORY_CARE_PROVIDER_SITE_OTHER): Payer: Medicaid Other | Admitting: Women's Health

## 2017-02-14 VITALS — BP 116/68 | HR 64 | Ht 67.0 in | Wt 282.5 lb

## 2017-02-14 DIAGNOSIS — Z0130 Encounter for examination of blood pressure without abnormal findings: Secondary | ICD-10-CM

## 2017-02-14 NOTE — Patient Instructions (Signed)
Call the office ([phone]) [facility] for these signs of pre-eclampsia:  Severe headache that does not go away with Tylenol  Visual changes- seeing spots, double, blurred vision  Pain under your right breast or upper abdomen that does not go away with Tums or heartburn medicine  Nausea and/or vomiting  Severe swelling in your hands, feet, and face       

## 2017-02-14 NOTE — Progress Notes (Signed)
   [facility] Visit  Patient name: Amanda Black MRN [id number]  Date of birth: [birth year]  CC & HPI:  Amanda Black is a 21 y.o. G70P1001 African American female [redacted]w[redacted]d s/p SVB after IOL for GHTN presenting today for bp check. No meds. Denies ha, visual changes, ruq/epigastric pain, n/v.  Bottlefeeding No LMP recorded. The current method of family planning is abstinence.  Pertinent History Reviewed:  Medical & Surgical Hx:   Past medical, surgical, family, and social history reviewed in electronic medical record Medications: Reviewed & Updated - see associated section Allergies: Reviewed in electronic medical record  Objective Findings:  Vitals: BP 116/68 (BP Location: Left Arm, Patient Position: Sitting, Cuff Size: Large)   Pulse 64   Ht  (1.702 m)   Wt 282 lb 8 oz (128.1 kg)   Breastfeeding? No   BMI 44.25 kg/m  Body mass index is 44.25 kg/m.  Physical Examination: General appearance - alert, well appearing, and in no distress  No results found for this or any previous visit (from the past 24 hour(s)).   Assessment & Plan:  A:   [redacted]w[redacted]d s/p SVB after IOL for GHTN  BP check  Bottlefeeding  P:  Discussed pre-e s/s  Abstinence until after pp visit, plans COCs  Return for As scheduled. 5/16 for pp visit  Marge Duncans CNM, [facility] 02/14/2017 12:28 PM

## 2017-03-16 ENCOUNTER — Encounter: Payer: Self-pay | Admitting: Women's Health

## 2017-03-16 ENCOUNTER — Encounter: Payer: Self-pay | Admitting: *Deleted

## 2017-03-16 ENCOUNTER — Telehealth: Payer: Self-pay | Admitting: Obstetrics & Gynecology

## 2017-03-16 ENCOUNTER — Ambulatory Visit (INDEPENDENT_AMBULATORY_CARE_PROVIDER_SITE_OTHER): Payer: Medicaid Other | Admitting: Women's Health

## 2017-03-16 DIAGNOSIS — Z3202 Encounter for pregnancy test, result negative: Secondary | ICD-10-CM | POA: Diagnosis not present

## 2017-03-16 LAB — POCT URINE PREGNANCY: Preg Test, Ur: NEGATIVE

## 2017-03-16 MED ORDER — NORETHIN-ETH ESTRAD-FE BIPHAS 1 MG-10 MCG / 10 MCG PO TABS: 1.0000 | ORAL_TABLET | Freq: Every day | ORAL | 3 refills | Status: AC

## 2017-03-16 NOTE — Patient Instructions (Signed)
Oral Contraception Use Oral contraceptive pills (OCPs) are medicines taken to prevent pregnancy. OCPs work by preventing the ovaries from releasing eggs. The hormones in OCPs also cause the cervical mucus to thicken, preventing the sperm from entering the uterus. The hormones also cause the uterine lining to become thin, not allowing a fertilized egg to attach to the inside of the uterus. OCPs are highly effective when taken exactly as prescribed. However, OCPs do not prevent sexually transmitted diseases (STDs). Safe sex practices, such as using condoms along with an OCP, can help prevent STDs. Before taking OCPs, you may have a physical exam and Pap test. Your health care provider may also order blood tests if necessary. Your health care provider will make sure you are a good candidate for oral contraception. Discuss with your health care provider the possible side effects of the OCP you may be prescribed. When starting an OCP, it can take 2 to 3 months for the body to adjust to the changes in hormone levels in your body. How to take oral contraceptive pills Your health care provider may advise you on how to start taking the first cycle of OCPs. Otherwise, you can:  Start on day 1 of your menstrual period. You will not need any backup contraceptive protection with this start time.  Start on the first Sunday after your menstrual period or the day you get your prescription. In these cases, you will need to use backup contraceptive protection for the first week.  Start the pill at any time of your cycle. If you take the pill within 5 days of the start of your period, you are protected against pregnancy right away. In this case, you will not need a backup form of birth control. If you start at any other time of your menstrual cycle, you will need to use another form of birth control for 7 days. If your OCP is the type called a minipill, it will protect you from pregnancy after taking it for 2 days (48  hours).  After you have started taking OCPs:  If you forget to take 1 pill, take it as soon as you remember. Take the next pill at the regular time.  If you miss 2 or more pills, call your health care provider because different pills have different instructions for missed doses. Use backup birth control until your next menstrual period starts.  If you use a 28-day pack that contains inactive pills and you miss 1 of the last 7 pills (pills with no hormones), it will not matter. Throw away the rest of the non-hormone pills and start a new pill pack.  No matter which day you start the OCP, you will always start a new pack on that same day of the week. Have an extra pack of OCPs and a backup contraceptive method available in case you miss some pills or lose your OCP pack. Follow these instructions at home:  Do not smoke.  Always use a condom to protect against STDs. OCPs do not protect against STDs.  Use a calendar to mark your menstrual period days.  Read the information and directions that came with your OCP. Talk to your health care provider if you have questions. Contact a health care provider if:  You develop nausea and vomiting.  You have abnormal vaginal discharge or bleeding.  You develop a rash.  You miss your menstrual period.  You are losing your hair.  You need treatment for mood swings or depression.  You   get dizzy when taking the OCP.  You develop acne from taking the OCP.  You become pregnant. Get help right away if:  You develop chest pain.  You develop shortness of breath.  You have an uncontrolled or severe headache.  You develop numbness or slurred speech.  You develop visual problems.  You develop pain, redness, and swelling in the legs. This information is not intended to replace advice given to you by your health care provider. Make sure you discuss any questions you have with your health care provider. Document Released: 10/07/2011 Document  Revised: 03/25/2016 Document Reviewed: 04/08/2013 [employer] Interactive Patient Education  2017 [employer]  

## 2017-03-16 NOTE — Progress Notes (Signed)
Subjective:    Amanda Black is a 21 y.o. 220P1001 African American female who presents for a postpartum visit. She is 5 weeks postpartum following a spontaneous vaginal delivery at 37.4 gestational weeks after IOL for GHTN. Anesthesia: epidural. I have fully reviewed the prenatal and intrapartum course. Postpartum course has been uncomplicated. Baby's course has been uncomplicated. Baby is feeding by bottle. Bleeding on period, started Wed. Bowel function is normal. Bladder function is normal. Patient is sexually active. Last sexual activity: last Sat. Contraception method is none and wants COCs. Does not smoke, no h/o HTN, DVT/PE, CVA, MI, or migraines w/ aura. Postpartum depression screening: negative. Score 0.  Last pap never, turned 21yo in Nov.  The following portions of the patient's history were reviewed and updated as appropriate: allergies, current medications, past medical history, past surgical history and problem list.  Review of Systems Pertinent items are noted in HPI.   Vitals:   03/16/17 0957  BP: 118/82  Pulse: 60  Weight: 282 lb (127.9 kg)  Height: 5\' 7"  (1.702 m)   Patient's last menstrual period was 03/09/2017 (exact date).  Objective:   General:  alert, cooperative and no distress   Breasts:  deferred, no complaints  Lungs: clear to auscultation bilaterally  Heart:  regular rate and rhythm  Abdomen: soft, nontender   Vulva: normal  Vagina: normal vagina  Cervix:  closed  Corpus: Well-involuted  Adnexa:  Non-palpable  Rectal Exam: No hemorrhoids        Assessment:   Postpartum exam 5 wks s/p SVB after IOL for GHTN Bottlefeeding Depression screening Contraception counseling   Plan:  Contraception: rx LoLoestrin 3pk w/ 3RF  Condoms x 2wks for backup, always for STI prevention Follow up in: 3 months for pap & physical and coc f/u, or earlier if needed  Marge DuncansBooker, Undray Allman Randall CNM, WHNP-BC 03/16/2017 10:12 AM

## 2017-06-16 ENCOUNTER — Other Ambulatory Visit: Payer: Medicaid Other | Admitting: Women's Health
